# Patient Record
Sex: Female | Born: 1970 | Race: Black or African American | Hispanic: No | Marital: Married | Smoking: Never smoker
Health system: Southern US, Community
[De-identification: ages and names within clinical notes are randomized; demographics above are authoritative.]

## PROBLEM LIST (undated history)

## (undated) DIAGNOSIS — B009 Herpesviral infection, unspecified: Secondary | ICD-10-CM

## (undated) DIAGNOSIS — D219 Benign neoplasm of connective and other soft tissue, unspecified: Secondary | ICD-10-CM

## (undated) DIAGNOSIS — N83202 Unspecified ovarian cyst, left side: Secondary | ICD-10-CM

## (undated) HISTORY — DX: Benign neoplasm of connective and other soft tissue, unspecified: D21.9

## (undated) HISTORY — DX: Herpesviral infection, unspecified: B00.9

## (undated) HISTORY — DX: Unspecified ovarian cyst, left side: N83.202

---

## 1993-05-27 HISTORY — PX: MOLE REMOVAL: SHX2046

## 2004-05-27 HISTORY — PX: COLONOSCOPY: SHX174

## 2005-11-15 ENCOUNTER — Ambulatory Visit: Payer: Self-pay | Admitting: Family Medicine

## 2005-11-29 ENCOUNTER — Other Ambulatory Visit: Admission: RE | Admit: 2005-11-29 | Discharge: 2005-11-29 | Payer: Self-pay | Admitting: Obstetrics and Gynecology

## 2005-12-03 ENCOUNTER — Ambulatory Visit: Payer: Self-pay | Admitting: Internal Medicine

## 2006-03-18 LAB — CONVERTED CEMR LAB: Pap Smear: NORMAL

## 2008-03-01 ENCOUNTER — Emergency Department (HOSPITAL_COMMUNITY): Admission: EM | Admit: 2008-03-01 | Discharge: 2008-03-01 | Payer: Self-pay | Admitting: Emergency Medicine

## 2008-03-08 ENCOUNTER — Ambulatory Visit: Payer: Self-pay | Admitting: Internal Medicine

## 2008-03-08 DIAGNOSIS — R0602 Shortness of breath: Secondary | ICD-10-CM | POA: Insufficient documentation

## 2008-03-08 DIAGNOSIS — J310 Chronic rhinitis: Secondary | ICD-10-CM | POA: Insufficient documentation

## 2008-03-29 ENCOUNTER — Telehealth: Payer: Self-pay | Admitting: Internal Medicine

## 2008-04-01 ENCOUNTER — Telehealth: Payer: Self-pay | Admitting: Internal Medicine

## 2008-04-08 ENCOUNTER — Ambulatory Visit: Payer: Self-pay | Admitting: *Deleted

## 2008-04-08 DIAGNOSIS — J019 Acute sinusitis, unspecified: Secondary | ICD-10-CM | POA: Insufficient documentation

## 2008-04-11 ENCOUNTER — Telehealth (INDEPENDENT_AMBULATORY_CARE_PROVIDER_SITE_OTHER): Payer: Self-pay | Admitting: *Deleted

## 2008-04-19 ENCOUNTER — Ambulatory Visit: Payer: Self-pay | Admitting: *Deleted

## 2008-04-19 DIAGNOSIS — J309 Allergic rhinitis, unspecified: Secondary | ICD-10-CM | POA: Insufficient documentation

## 2008-04-25 ENCOUNTER — Telehealth: Payer: Self-pay | Admitting: Internal Medicine

## 2008-04-27 ENCOUNTER — Telehealth (INDEPENDENT_AMBULATORY_CARE_PROVIDER_SITE_OTHER): Payer: Self-pay | Admitting: *Deleted

## 2008-04-27 ENCOUNTER — Ambulatory Visit (HOSPITAL_BASED_OUTPATIENT_CLINIC_OR_DEPARTMENT_OTHER): Admission: RE | Admit: 2008-04-27 | Discharge: 2008-04-27 | Payer: Self-pay | Admitting: *Deleted

## 2008-04-27 ENCOUNTER — Ambulatory Visit: Payer: Self-pay | Admitting: Diagnostic Radiology

## 2008-04-27 DIAGNOSIS — J329 Chronic sinusitis, unspecified: Secondary | ICD-10-CM | POA: Insufficient documentation

## 2008-05-02 ENCOUNTER — Telehealth (INDEPENDENT_AMBULATORY_CARE_PROVIDER_SITE_OTHER): Payer: Self-pay | Admitting: *Deleted

## 2008-05-04 ENCOUNTER — Encounter (INDEPENDENT_AMBULATORY_CARE_PROVIDER_SITE_OTHER): Payer: Self-pay | Admitting: *Deleted

## 2008-05-23 ENCOUNTER — Telehealth (INDEPENDENT_AMBULATORY_CARE_PROVIDER_SITE_OTHER): Payer: Self-pay | Admitting: *Deleted

## 2008-06-01 ENCOUNTER — Telehealth (INDEPENDENT_AMBULATORY_CARE_PROVIDER_SITE_OTHER): Payer: Self-pay | Admitting: *Deleted

## 2008-06-09 ENCOUNTER — Telehealth (INDEPENDENT_AMBULATORY_CARE_PROVIDER_SITE_OTHER): Payer: Self-pay | Admitting: *Deleted

## 2008-06-21 ENCOUNTER — Encounter (INDEPENDENT_AMBULATORY_CARE_PROVIDER_SITE_OTHER): Payer: Self-pay | Admitting: *Deleted

## 2008-07-01 ENCOUNTER — Ambulatory Visit: Payer: Self-pay | Admitting: Internal Medicine

## 2008-07-06 ENCOUNTER — Ambulatory Visit: Payer: Self-pay | Admitting: Cardiology

## 2008-08-04 ENCOUNTER — Encounter: Payer: Self-pay | Admitting: Internal Medicine

## 2008-08-09 ENCOUNTER — Ambulatory Visit: Payer: Self-pay | Admitting: Internal Medicine

## 2010-10-12 NOTE — Assessment & Plan Note (Signed)
[facility]                           GASTROENTEROLOGY OFFICE NOTE   NAME:Kathy Blankenship, Kathy Blankenship                         MRN:          [id number]  DATE:12/03/2005                            DOB:          17-Nov-1970    REFERRING PHYSICIAN:  Grayling Congress LOWNE   REFERRING PHYSICIAN:  Loreen Freud, MD   REASON FOR CONSULTATION:  Constipation and rectal pain and bleeding.   HISTORY:  This a pleasant 40 year old African-American female with no  significant past medical history, who was referred through the courtesy of  Dr. Laury Axon regarding constipation, rectal pain and rectal bleeding.  The  patient had similar problems while living in [place] earlier this year.  For that she underwent complete colonoscopy with Dr. Angela Burke.  The  report from that procedure is available for review.  It stated that the  patient had a complete exam and a good preparation.  No abnormalities found  except for small internal hemorrhoids.  The latter was felt likely  responsible for bleeding.  She was prescribed fiber and Zelnorm.  The  patient never took Zelnorm.  She was using Benefiber and states that this  was helpful.  She has not been using it over the past month and has had  recurrent problems with constipation.  As well, rectal discomfort with the  passage of stools intermittently and rectal bleeding intermittently.  No  significant abdominal pain.  She presents now for evaluation.   PAST MEDICAL HISTORY:  None.   PAST SURGICAL HISTORY:  None.   CURRENT MEDICATIONS:  None.   ALLERGIES:  NO KNOWN DRUG ALLERGIES.   FAMILY HISTORY:  Negative for gastrointestinal malignancy.   SOCIAL HISTORY:  The patient is a divorced without children and attended  college for two years, has returned to [place] after living in [place] for seven years.  She lives with her mother.  She does not smoke,  rarely uses alcohol.   REVIEW OF SYSTEMS:  Per diagnostic evaluation  form.   PHYSICAL EXAMINATION:  GENERAL:  Pleasant well appearing female in no acute  distress.  VITAL SIGNS:  Blood pressure 112/72, heart rate 68, weight 142.2 pounds.  She is 5 feet 4-1/2 inches in height.  HEENT:  Sclerae anicteric.  Conjunctivae pink.  Oral mucosa intact, no  adenopathy.  LUNGS:  Clear.  HEART:  Regular.  ABDOMEN:  Soft without tenderness, mass or hernia.  RECTAL:  Exam reveals no external abnormalities.  There is a small posterior  fissure which is tender.  EXTREMITIES:  Without edema.   IMPRESSION:  1.  Functional constipation.  2.  Rectal pain and bleeding due to fissure.   RECOMMENDATIONS:  1.  Resume Benefiber.  2.  MiraLax 17 g in 10 ounces of water daily, titrate to need.  3.  Sitz baths twice daily.  4.  Analpram cream twice daily as needed.  5.  GI follow-up as needed.  Wilhemina Bonito. Eda Keys., MD   JNP/MedQ  DD:  12/03/2005  DT:  12/03/2005  Job #:  [id number]   cc:   Loreen Freud, MD

## 2011-02-26 LAB — POCT CARDIAC MARKERS
CKMB, poc: <1 — ABNORMAL LOW
Myoglobin, poc: 51.5
Troponin i, poc: <0.05

## 2011-02-26 LAB — D-DIMER, QUANTITATIVE: D-Dimer, Quant: 0.54 — ABNORMAL HIGH

## 2011-02-26 LAB — POCT I-STAT, CHEM 8
Chloride: 110
Glucose, Bld: 93
HCT: 38
Hemoglobin: 12.9
TCO2: 22

## 2012-03-02 ENCOUNTER — Encounter: Payer: Self-pay | Admitting: Obstetrics and Gynecology

## 2012-03-02 ENCOUNTER — Ambulatory Visit (INDEPENDENT_AMBULATORY_CARE_PROVIDER_SITE_OTHER): Payer: 59 | Admitting: Obstetrics and Gynecology

## 2012-03-02 VITALS — BP 96/72 | Wt 140.0 lb

## 2012-03-02 DIAGNOSIS — N921 Excessive and frequent menstruation with irregular cycle: Secondary | ICD-10-CM

## 2012-03-02 DIAGNOSIS — R102 Pelvic and perineal pain: Secondary | ICD-10-CM

## 2012-03-02 DIAGNOSIS — N949 Unspecified condition associated with female genital organs and menstrual cycle: Secondary | ICD-10-CM

## 2012-03-02 LAB — CBC
HCT: 38.9 % (ref 36.0–46.0)
Hemoglobin: 13.4 g/dL (ref 12.0–15.0)
MCHC: 34.4 g/dL (ref 30.0–36.0)
RBC: 4.64 MIL/uL (ref 3.87–5.11)
RDW: 13.2 % (ref 11.5–15.5)

## 2012-03-02 LAB — TSH: TSH: 2.258 u[IU]/mL (ref 0.350–4.500)

## 2012-03-02 MED ORDER — ALPRAZOLAM 0.5 MG PO TABS: 0.5000 mg | ORAL_TABLET | Freq: Every evening | ORAL | Status: DC | PRN

## 2012-03-02 MED ORDER — NORETHIN-ETH ESTRADIOL-FE 0.4-35 MG-MCG PO CHEW: 1.0000 | CHEWABLE_TABLET | Freq: Every day | ORAL | Status: DC

## 2012-03-02 NOTE — Addendum Note (Signed)
Addended by: Jaymes Graff on: 03/02/2012 09:22 AM   Modules accepted: Orders

## 2012-03-02 NOTE — Progress Notes (Signed)
When did bleeding start: 02/03/12 How  Long: 3 weeks How often changing pad/tampon: wears panty liner Bleeding Disorders: no Cramping: yes Contraception: yes, pill Fibroids: yes Hormone Therapy: no New Medications: no Menopausal Symptoms: no Vag. Discharge: no Abdominal Pain: yes Increased Stress: no Pt has c/o of irregular bleeding on her pills for one year.  She had mild cramping and does not forget to take her pills BP 96/72  Wt 140 lb (63.504 kg)  LMP 02/03/2012 Physical Examination: General appearance - alert, well appearing, and in no distress Chest - clear to auscultation, no wheezes, rales or rhonchi, symmetric air entry Heart - normal rate and regular rhythm Abdomen - soft, nontender, nondistended, no masses or organomegaly Pelvic - normal external genitalia, vulva, vagina, cervix, uterus mildly irregular and adnexa Fibroids BTB on pills Check tsh, prl, cbc Us/shg/embx @[place] 

## 2012-03-02 NOTE — Addendum Note (Signed)
Addended by: Rolla Plate on: 03/02/2012 09:17 AM   Modules accepted: Orders

## 2012-03-02 NOTE — Patient Instructions (Signed)
Uterine Fibroid  A uterine fibroid is a growth (tumor) that occurs in a woman's uterus. This type of tumor is not cancerous and does not spread out of the uterus. A woman can have one or many fibroids, and the fiboid(s) can become quite large. A fibroid can vary in size, weight, and where it grows in the uterus. Most fibroids do not require medical treatment, but some can cause pain or heavy bleeding during and between periods.  CAUSES   A fibroid is the result of a single uterine cell that keeps growing (unregulated), which is different than most cells in the human body. Most cells have a control mechanism that keeps them from reproducing without control.   SYMPTOMS    Bleeding.   Pelvic pain and pressure.   Bladder problems due to the size of the fibroid.   Infertility and miscarriages depending on the size and location of the fibroid.  DIAGNOSIS   A diagnosis is made by physical exam. Your caregiver may feel the lumpy tumors during a pelvic exam. Important information regarding size, location, and number of tumors can be gained by having an ultrasound. It is rare that other tests, such as a CT scan or MRI, are needed.  TREATMENT    Your caregiver may recommend watchful waiting. This involves getting the fibroid checked by your caregiver to see if the fibroids grow or shrink.    Hormonal treatment or an intrauterine device (IUD) may be prescribed.    Surgery may be needed to remove the fibroids (myomectomy) or the uterus (hysterectomy). This depends on your situation.  When fibroids interfere with fertility and a woman wants to become pregnant, a caregiver may recommend having the fibroids removed.   HOME CARE INSTRUCTIONS   Home care depends on how you were treated. In general:    Keep all follow-up appointments with your caregiver.    Only take medicine as told by your caregiver. Do not take aspirin. It can cause bleeding.    If you have excessive periods and soak tampons or pads in a half hour or  less, contact your caregiver immediately. If your periods are troublesome but not so heavy, lie down with your feet raised slightly above your heart. Place cold packs on your lower abdomen.    If your periods are heavy, write down the number of pads or tampons you use per month. Bring this information to your caregiver.    Talk to your caregiver about taking iron pills.    Include green vegetables in your diet.    If you were prescribed a hormonal treatment, take the hormonal medicines as directed.    If you need surgery, ask your caregiver for information on your specific surgery.   SEEK IMMEDIATE MEDICAL CARE IF:   You have pelvic pain or cramps not controlled with medicines.    You have a sudden increase in pelvic pain.    You have an increase of bleeding between and during periods.    You feel lightheaded or have fainting episodes.   MAKE SURE YOU:   Understand these instructions.   Will watch your condition.   Will get help right away if you are not doing well or get worse.  Document Released: 05/10/2000 Document Revised: 08/05/2011 Document Reviewed: 06/03/2011  ExitCare Patient Information 2013 [employer].

## 2012-03-03 ENCOUNTER — Telehealth: Payer: Self-pay | Admitting: Obstetrics and Gynecology

## 2012-03-03 LAB — GC/CHLAMYDIA PROBE AMP, GENITAL: GC Probe Amp, Genital: NEGATIVE

## 2012-03-03 NOTE — Telephone Encounter (Signed)
Spoke with pt rgd msg pt has some questions rgd starting new bc and sonohyst advised pt complete current pack of pills then start new pack the call first day of cycle to schd sonohyst pt voice understanding

## 2012-04-16 ENCOUNTER — Telehealth: Payer: Self-pay | Admitting: Obstetrics and Gynecology

## 2012-04-16 NOTE — Telephone Encounter (Signed)
Tc to pt regarding msg.  Pt states she is suppose to have a SHG but was told was going to be while before an available appt.  So, pt is currently on Femcon Fe to help with her bldg.  Was switched to higher dosage per ND because lower dosage was causing her to have bldg everyday.  Higher dosage was helping for a while, did not start cycle at placebos, came on about a week later and is experiencing cramping in her legs and back.  No meds taken, pt wants to know if what she is experiencing is normal.  Informed per ND, pt advised to continue taking pills everyday on time and try taking Motrin 600 mg Q 6hrs and will be working out a sooner appt for the [facility].  Pt voices agreement, pt transferred to [facility] for scheduling of SHG.

## 2012-04-16 NOTE — Telephone Encounter (Signed)
Tc to pt regarding msg, lm on v to call back.

## 2012-04-27 ENCOUNTER — Ambulatory Visit (INDEPENDENT_AMBULATORY_CARE_PROVIDER_SITE_OTHER): Payer: 59 | Admitting: Obstetrics and Gynecology

## 2012-04-27 ENCOUNTER — Encounter: Payer: Self-pay | Admitting: Obstetrics and Gynecology

## 2012-04-27 ENCOUNTER — Ambulatory Visit: Payer: 59

## 2012-04-27 VITALS — BP 118/76 | Wt 142.0 lb

## 2012-04-27 DIAGNOSIS — N921 Excessive and frequent menstruation with irregular cycle: Secondary | ICD-10-CM

## 2012-04-27 DIAGNOSIS — Z0000 Encounter for general adult medical examination without abnormal findings: Secondary | ICD-10-CM

## 2012-04-27 DIAGNOSIS — IMO0002 Reserved for concepts with insufficient information to code with codable children: Secondary | ICD-10-CM

## 2012-04-27 DIAGNOSIS — N926 Irregular menstruation, unspecified: Secondary | ICD-10-CM

## 2012-04-27 NOTE — Patient Instructions (Signed)
Endometrial Biopsy This is a test in which a tissue sample (a biopsy) is taken from inside the uterus (womb). It is then looked at by a specialist under a microscope to see if the tissue is normal or abnormal. The endometrium is the lining of the uterus. This test helps determine where you are in your menstrual cycle and how hormone levels are affecting the lining of the uterus. Another use for this test is to diagnose endometrial cancer, tuberculosis, polyps, or inflammatory conditions and to evaluate uterine bleeding. PREPARATION FOR TEST No preparation or fasting is necessary. NORMAL FINDINGS No pathologic conditions. Presence of "secretory-type" endometrium 3 to 5 days before to normal menstruation. Ranges for normal findings may vary among different laboratories and hospitals. You should always check with your doctor after having lab work or other tests done to discuss the meaning of your test results and whether your values are considered within normal limits. MEANING OF TEST  Your caregiver will go over the test results with you and discuss the importance and meaning of your results, as well as treatment options and the need for additional tests if necessary. OBTAINING THE TEST RESULTS It is your responsibility to obtain your test results. Ask the lab or department performing the test when and how you will get your results. Document Released: 09/13/2004 Document Revised: 08/05/2011 Document Reviewed: 04/22/2008 ExitCare Patient Information 2013 [employer].  

## 2012-04-27 NOTE — Progress Notes (Signed)
Pt came late and unable to do SHG BP 118/76  Wt 142 lb (64.411 kg)  LMP 04/15/2012 Physical Examination: Abdomen - soft, nontender, nondistended, no masses or organomegaly Pelvic - VULVA: normal appearing vulva with no masses, tenderness or lesions, VAGINA: normal appearing vagina with normal color and discharge, no lesions, CERVIX: normal appearing cervix without discharge or lesions Menorrhagia Pt states it is better with OCPS Motrin prn dysmenorrhea SHG r/S EMBX done per protocol.  Sound to 7 cm Motrin 400mg  given today after procedure.  Pt had already taken 400mg 

## 2012-04-29 LAB — PATHOLOGY

## 2012-05-18 ENCOUNTER — Other Ambulatory Visit: Payer: Self-pay | Admitting: Obstetrics and Gynecology

## 2012-05-18 ENCOUNTER — Ambulatory Visit (INDEPENDENT_AMBULATORY_CARE_PROVIDER_SITE_OTHER): Payer: 59 | Admitting: Obstetrics and Gynecology

## 2012-05-18 ENCOUNTER — Encounter: Payer: Self-pay | Admitting: Obstetrics and Gynecology

## 2012-05-18 ENCOUNTER — Ambulatory Visit (INDEPENDENT_AMBULATORY_CARE_PROVIDER_SITE_OTHER): Payer: 59

## 2012-05-18 ENCOUNTER — Other Ambulatory Visit: Payer: Self-pay

## 2012-05-18 VITALS — BP 110/78 | Wt 144.5 lb

## 2012-05-18 DIAGNOSIS — N926 Irregular menstruation, unspecified: Secondary | ICD-10-CM

## 2012-05-18 DIAGNOSIS — N840 Polyp of corpus uteri: Secondary | ICD-10-CM

## 2012-05-18 DIAGNOSIS — N83209 Unspecified ovarian cyst, unspecified side: Secondary | ICD-10-CM

## 2012-05-18 MED ORDER — NORETHIN-ETH ESTRADIOL-FE 0.4-35 MG-MCG PO CHEW: 1.0000 | CHEWABLE_TABLET | Freq: Every day | ORAL | Status: DC

## 2012-05-18 NOTE — Progress Notes (Signed)
[place] 8.59 cm by 5.27 cm Bilateral ovaries seen.  4.27 cm simple ovarian cyst Right ovary WNL SHG 1.3 cm polyp seen.  SHG done per protocol Pt given option of OBS vs hystereroscopic polypectomy R&B of both reviewed.  Pt chose OBS Repeat SHG/US in three months to see if polyp and ovarian cyst has resolved

## 2012-05-29 ENCOUNTER — Telehealth: Payer: Self-pay | Admitting: Obstetrics and Gynecology

## 2012-05-29 ENCOUNTER — Ambulatory Visit (HOSPITAL_COMMUNITY)
Admission: RE | Admit: 2012-05-29 | Discharge: 2012-05-29 | Disposition: A | Discharge status: Home / Self Care | Payer: 59 | Source: Ambulatory Visit | Attending: Obstetrics and Gynecology | Admitting: Obstetrics and Gynecology

## 2012-05-29 DIAGNOSIS — M79659 Pain in unspecified thigh: Secondary | ICD-10-CM

## 2012-05-29 DIAGNOSIS — M79609 Pain in unspecified limb: Secondary | ICD-10-CM | POA: Insufficient documentation

## 2012-05-29 DIAGNOSIS — M79661 Pain in right lower leg: Secondary | ICD-10-CM

## 2012-05-29 DIAGNOSIS — M79662 Pain in left lower leg: Secondary | ICD-10-CM

## 2012-05-29 NOTE — Telephone Encounter (Signed)
Spoke with pt rgd msg pt states on bc  Having calf and thigh pain in both legs after starting new pack of pills on 05/24/12 pt tried taking ibuprofen no relief advised pt will consult with nd and call her back pt voice understanding

## 2012-05-29 NOTE — Telephone Encounter (Signed)
Consult with nd informed info below per ND pt need doppler both legs and calves to  r/o dvt call report to AVS dr on call pt has appt at [facility] long at 2:00 pt voice understanding informed AVS of pt going to have dopplers done

## 2012-05-29 NOTE — Progress Notes (Addendum)
*  Preliminary Results* Bilateral lower extremity venous duplex completed. Bilateral lower extremities are negative for deep vein thrombosis. No evidence of Baker's cyst bilaterally. Preliminary results discussed with Dr.Stringer.  05/29/2012 3:08 PM Gertie Fey, RDMS, RDCS

## 2012-11-17 ENCOUNTER — Encounter: Payer: Self-pay | Admitting: *Deleted

## 2012-11-17 ENCOUNTER — Emergency Department
Admission: EM | Admit: 2012-11-17 | Discharge: 2012-11-17 | Disposition: A | Discharge status: Home / Self Care | Payer: Self-pay | Source: Home / Self Care | Attending: Family Medicine | Admitting: Family Medicine

## 2012-11-17 DIAGNOSIS — L723 Sebaceous cyst: Secondary | ICD-10-CM

## 2012-11-17 MED ORDER — DOXYCYCLINE HYCLATE 100 MG PO CAPS: 100.0000 mg | ORAL_CAPSULE | Freq: Two times a day (BID) | ORAL | Status: DC

## 2012-11-17 NOTE — ED Notes (Signed)
Kathy Blankenship reports lump/infection to left axilla x 4 days. Denies fever or chills. No hx of MRSA.

## 2012-11-17 NOTE — ED Provider Notes (Signed)
History    CSN: [id number] Arrival date & time 11/17/12  1118  First MD Initiated Contact with Patient 11/17/12 1133     Chief Complaint  Patient presents with  . Abcess/skin infection       HPI Comments: Patient complains of a 4 day history of tender nodule in left axilla, gradually expanding.  No drainage from lesion.  No fevers, chills, and sweats   Patient is a 42 y.o. female presenting with abscess. The history is provided by the patient.  Abscess Abscess location: left axilla. Size:  2cm by 1.5cm Abscess quality: fluctuance, induration, painful and redness   Abscess quality: not draining, no itching, no warmth and not weeping   Red streaking: no   Duration:  4 days Progression:  Worsening Pain details:    Quality:  Pressure   Severity:  Mild   Duration:  4 days   Timing:  Constant   Progression:  Worsening Chronicity:  New Relieved by:  Nothing Worsened by:  Nothing tried Ineffective treatments: topical ointment. Associated symptoms: no fatigue and no fever   Risk factors: no hx of MRSA    Past Medical History  Diagnosis Date  . HSV-2 (herpes simplex virus 2) infection   . Fibroids   . Left ovarian cyst    History reviewed. No pertinent past surgical history. Family History  Problem Relation Age of Onset  . Rheum arthritis Father    History  Substance Use Topics  . Smoking status: Never Smoker   . Smokeless tobacco: Never Used  . Alcohol Use: No   OB History   Grav Para Term Preterm Abortions TAB SAB Ect Mult Living   0 0        0     Review of Systems  Constitutional: Negative for fever and fatigue.  All other systems reviewed and are negative.    Allergies  Review of patient's allergies indicates no known allergies.  Home Medications   Current Outpatient Rx  Name  Route  Sig  Dispense  Refill  . ALPRAZolam (XANAX) 0.5 MG tablet   Oral   Take 1 tablet (0.5 mg total) by mouth at bedtime as needed.   4 tablet   0     Take thirty  minutes before procedure   . doxycycline (VIBRAMYCIN) 100 MG capsule   Oral   Take 1 capsule (100 mg total) by mouth 2 (two) times daily.   14 capsule   0   . Norethin-Eth Estradiol-Fe [facility] FE) 0.4-35 MG-MCG tablet   Oral   Chew 1 tablet by mouth daily.   1 Package   11     Please give the 0.4-50 mcg dose    BP 119/82  Pulse 83  Temp(Src) 99 F (37.2 C) (Oral)  Resp 14  Ht 5\' 5"  (1.651 m)  Wt 138 lb (62.596 kg)  BMI 22.96 kg/m2  SpO2 99%  LMP 11/15/2012 Physical Exam  Nursing note and vitals reviewed. Constitutional: She is oriented to person, place, and time. She appears well-developed and well-nourished. No distress.  HENT:  Head: Normocephalic.  Eyes: Conjunctivae are normal. Pupils are equal, round, and reactive to light.  Cardiovascular: Normal heart sounds.   Pulmonary/Chest: Breath sounds normal.  Lymphadenopathy:    She has no cervical adenopathy.  Neurological: She is alert and oriented to person, place, and time.  Skin: Skin is warm and dry.  Left axilla has a fluctuant 2cm by 1.5cm cystic mass, mildly tender to palpation.  No surrounding erythema.  No axillary adenopathy.    ED Course  Procedures  Procedure:  Incise and drain cyst/abscess Risks and benefits of procedure explained to patient and verbal consent obtained.  Using sterile technique and local anesthesia with 1% lidocaine with epinephrine, cleansed affected area with Betadine and saline. Identified the most fluctuant area of lesion and incised with #11 blade.  Expressed purulent material.  Inserted Iodoform gauze packing.  Bandage applied.  Patient tolerated well     Labs Reviewed  WOUND CULTURE pending    1. Infected sebaceous cyst of skin     MDM  Wound culture pending.  Begin doxycycline Return tomorrow for packing removal and dressing change Leave bandage in place until followup visit tomorrow, then change bandage daily until healed.  Lattie Haw, MD 11/17/12 [phone]

## 2012-11-18 ENCOUNTER — Encounter: Payer: Self-pay | Admitting: *Deleted

## 2012-11-18 ENCOUNTER — Emergency Department (INDEPENDENT_AMBULATORY_CARE_PROVIDER_SITE_OTHER)
Admission: EM | Admit: 2012-11-18 | Discharge: 2012-11-18 | Disposition: A | Discharge status: Home / Self Care | Payer: 59 | Source: Home / Self Care | Attending: Emergency Medicine | Admitting: Emergency Medicine

## 2012-11-18 DIAGNOSIS — L02412 Cutaneous abscess of left axilla: Secondary | ICD-10-CM

## 2012-11-18 DIAGNOSIS — IMO0002 Reserved for concepts with insufficient information to code with codable children: Secondary | ICD-10-CM

## 2012-11-18 NOTE — ED Notes (Signed)
The pt is here today for a wound check in her LT axilla area. She had an I & D yesterday and was placed on Doxycycline.

## 2012-11-18 NOTE — ED Provider Notes (Signed)
History    CSN: [id number] Arrival date & time 11/18/12  1110  First MD Initiated Contact with Patient 11/18/12 1121     Chief Complaint  Patient presents with  . Wound Check    HPI  Yesterday had I and D of left axillary abscess. Here for recheck of the wound. She started the doxycycline yesterday as prescribed. She denies any fever. Has not noted any drainage or bleeding on the dressing since the I&D was performed and iodoform gauze was placed. The pain left axilla is significantly decreased. Only has minimal pain to touching. Not needing any pain medication. She has returned to her usual function. Past Medical History  Diagnosis Date  . HSV-2 (herpes simplex virus 2) infection   . Fibroids   . Left ovarian cyst    History reviewed. No pertinent past surgical history. Family History  Problem Relation Age of Onset  . Rheum arthritis Father    History  Substance Use Topics  . Smoking status: Never Smoker   . Smokeless tobacco: Never Used  . Alcohol Use: No   OB History   Grav Para Term Preterm Abortions TAB SAB Ect Mult Living   0 0        0     Review of Systems  Constitutional: Negative for fever.  All other systems reviewed and are negative.    Allergies  Review of patient's allergies indicates no known allergies.  Home Medications   Current Outpatient Rx  Name  Route  Sig  Dispense  Refill  . ALPRAZolam (XANAX) 0.5 MG tablet   Oral   Take 1 tablet (0.5 mg total) by mouth at bedtime as needed.   4 tablet   0     Take thirty minutes before procedure   . doxycycline (VIBRAMYCIN) 100 MG capsule   Oral   Take 1 capsule (100 mg total) by mouth 2 (two) times daily.   14 capsule   0   . Norethin-Eth Estradiol-Fe [facility] FE) 0.4-35 MG-MCG tablet   Oral   Chew 1 tablet by mouth daily.   1 Package   11     Please give the 0.4-50 mcg dose    BP 131/84  Pulse 98  Temp(Src) 98.9 F (37.2 C) (Oral)  Resp 18  Wt 137 lb (62.143 kg)  BMI 22.8 kg/m2   SpO2 99%  LMP 11/15/2012 Physical Exam  Nursing note and vitals reviewed. Constitutional: She is oriented to person, place, and time. She appears well-developed and well-nourished. No distress.  HENT:  Head: Normocephalic and atraumatic.  Eyes: Conjunctivae and EOM are normal. Pupils are equal, round, and reactive to light. No scleral icterus.  Neck: Normal range of motion.  Cardiovascular: Normal rate.   Pulmonary/Chest: Effort normal.  Abdominal: She exhibits no distension.  Musculoskeletal: Normal range of motion.  Neurological: She is alert and oriented to person, place, and time.  Skin: Skin is warm.  Psychiatric: She has a normal mood and affect.   Left axilla: There is a 1 x 1 cm area of mild swelling, mild redness, minimal tenderness, without drainage or bleeding. In the center is intact iodoform gauze that was placed yesterday.--No red streaks. Function of left upper extremity normal. Neurovascular distally intact  ED Course  Procedures (including critical care time) Labs Reviewed - No data to display No results found. 1. Abscess of left axilla     MDM  Doing well. Abscess of left axilla improving significantly after I&D yesterday. Today, I  withdrew the iodoform gauze 1/2 inch and elected to keep the remainder of gauze in place for now.--She tolerated this well. New bulky dressing applied .--Home care discussed, keep clean and dry until returning here in 2 days for complete removal of the iodoform gauze. I checked the status of the wound culture from yesterday, which is still pending. Continue doxycycline as prescribed. Return here in 2 days for recheck and anticipate complete removal of the iodoform gauze.  Lajean Manes, MD 11/18/12 [phone]

## 2012-11-19 ENCOUNTER — Telehealth: Payer: Self-pay | Admitting: Emergency Medicine

## 2012-11-19 LAB — WOUND CULTURE
Gram Stain: NONE SEEN
Organism ID, Bacteria: NO GROWTH

## 2012-11-20 ENCOUNTER — Encounter: Payer: Self-pay | Admitting: *Deleted

## 2012-11-20 ENCOUNTER — Emergency Department (INDEPENDENT_AMBULATORY_CARE_PROVIDER_SITE_OTHER)
Admission: EM | Admit: 2012-11-20 | Discharge: 2012-11-20 | Disposition: A | Discharge status: Home / Self Care | Payer: 59 | Source: Home / Self Care | Attending: Family Medicine | Admitting: Family Medicine

## 2012-11-20 DIAGNOSIS — Z4801 Encounter for change or removal of surgical wound dressing: Secondary | ICD-10-CM

## 2012-11-20 NOTE — ED Notes (Signed)
F/u on I&D of left axilla. Denies worsening pain, fever/chills.

## 2012-11-20 NOTE — ED Provider Notes (Signed)
   History    CSN: [id number] Arrival date & time 11/20/12  1051  First MD Initiated Contact with Patient 11/20/12 1126     Chief Complaint  Patient presents with  . Wound Check      HPI Comments: Presents for dressing change left axilla.  No complaints.  Patient is a 42 y.o. female presenting with wound check. The history is provided by the patient.  Wound Check This is a new problem. The problem has been rapidly improving. Associated symptoms comments: none.   Past Medical History  Diagnosis Date  . HSV-2 (herpes simplex virus 2) infection   . Fibroids   . Left ovarian cyst    History reviewed. No pertinent past surgical history. Family History  Problem Relation Age of Onset  . Rheum arthritis Father    History  Substance Use Topics  . Smoking status: Never Smoker   . Smokeless tobacco: Never Used  . Alcohol Use: No   OB History   Grav Para Term Preterm Abortions TAB SAB Ect Mult Living   0 0        0     Review of Systems  All other systems reviewed and are negative.    Allergies  Review of patient's allergies indicates no known allergies.  Home Medications   Current Outpatient Rx  Name  Route  Sig  Dispense  Refill  . ALPRAZolam (XANAX) 0.5 MG tablet   Oral   Take 1 tablet (0.5 mg total) by mouth at bedtime as needed.   4 tablet   0     Take thirty minutes before procedure   . doxycycline (VIBRAMYCIN) 100 MG capsule   Oral   Take 1 capsule (100 mg total) by mouth 2 (two) times daily.   14 capsule   0   . Norethin-Eth Estradiol-Fe [facility] FE) 0.4-35 MG-MCG tablet   Oral   Chew 1 tablet by mouth daily.   1 Package   11     Please give the 0.4-50 mcg dose    BP 119/80  Pulse 68  Temp(Src) 98.9 F (37.2 C) (Oral)  Resp 12  SpO2 100%  LMP 11/15/2012 Physical Exam Nursing notes and Vital Signs reviewed. Appearance:  Patient appears healthy, stated age, and in no acute distress Left axilla:  No swelling, erythema, warmth, or tenderness.   Removed iodoform gauze; no purulent drainage.  Wound cavity approximately 2 to 3mm deep.  Applied bandage. Skin:  No rash present.   ED Course  Procedures  none   1. Dressing change or removal, surgical wound     MDM   Change dressing daily until healed.  Finish doxycycline.  Lattie Haw, MD 11/20/12 1144

## 2013-12-14 DIAGNOSIS — R0989 Other specified symptoms and signs involving the circulatory and respiratory systems: Secondary | ICD-10-CM | POA: Insufficient documentation

## 2013-12-14 DIAGNOSIS — R29898 Other symptoms and signs involving the musculoskeletal system: Secondary | ICD-10-CM | POA: Insufficient documentation

## 2013-12-20 ENCOUNTER — Ambulatory Visit (INDEPENDENT_AMBULATORY_CARE_PROVIDER_SITE_OTHER): Payer: PRIVATE HEALTH INSURANCE | Admitting: Family Medicine

## 2013-12-20 ENCOUNTER — Encounter: Payer: Self-pay | Admitting: Family Medicine

## 2013-12-20 VITALS — BP 105/64 | HR 92 | Ht 65.0 in | Wt 144.0 lb

## 2013-12-20 DIAGNOSIS — E785 Hyperlipidemia, unspecified: Secondary | ICD-10-CM | POA: Insufficient documentation

## 2013-12-20 DIAGNOSIS — Z2839 Other underimmunization status: Secondary | ICD-10-CM

## 2013-12-20 DIAGNOSIS — J309 Allergic rhinitis, unspecified: Secondary | ICD-10-CM

## 2013-12-20 DIAGNOSIS — Z23 Encounter for immunization: Secondary | ICD-10-CM

## 2013-12-20 DIAGNOSIS — J302 Other seasonal allergic rhinitis: Secondary | ICD-10-CM

## 2013-12-20 DIAGNOSIS — G459 Transient cerebral ischemic attack, unspecified: Secondary | ICD-10-CM

## 2013-12-20 DIAGNOSIS — Z283 Underimmunization status: Secondary | ICD-10-CM

## 2013-12-20 MED ORDER — ASPIRIN EC 325 MG PO TBEC: 325.0000 mg | DELAYED_RELEASE_TABLET | Freq: Every day | ORAL | Status: DC

## 2013-12-20 MED ORDER — CETIRIZINE HCL 10 MG PO TABS: 10.0000 mg | ORAL_TABLET | Freq: Every day | ORAL | Status: DC

## 2013-12-20 NOTE — Progress Notes (Signed)
CC: Kathy Blankenship is a 43 y.o. female is here for Establish Care   Subjective: HPI:  Very pleasant 43 year old here to establish care.  Patient presents for hospital followup from a one-day hospitalization for left upper extremity weakness. She describes awakening for no particular reason at 3 AM on the 21st and noticed that her entire left arm felt numb and somewhat weak. Over the course of a few minutes she noticed that the intensity of this was increasing from mild to moderate in severity. She presented about an hour later at a local emergency room she described the sensation as severe. She believes that the episode lasted a total of 4 hours and did not resolve from anything in particular that she is aware of. While being hospitalized she had a unremarkable metabolic panel, unremarkable CBC, unremarkable lipid panel. EKG was unremarkable, MRI of the brain was normal CT scan of the head was normal, echocardiogram was unremarkable. Carotid ultrasound was normal. She was started on aspirin and Lipitor but has reservations about taking Lipitor and is only taking aspirin at this time. She denies any other motor or sensory disturbances recently or remotely. She's only had this episode once and has no history of TIAs or strokes leading up to this event. Since discharge she states she's back to her regular state of health.  Patient complains of persistent difficulty with fighting allergies present at different times of the year and described as a stuffiness in the nose and pressure between the eyes and below the eyes. Symptoms are slightly improved with essential oils placed on her face. No other interventions as of yet. Symptoms seem to be worse in the evening and when trying to fall sleep often keeping her awake at night. Overall symptoms are moderate in severity and present most days of the year. Nothing particularly makes better or worse other than that described above. She's particularly inquired about  whether or not she should see an allergist. She's had no benefit from nasal steroids specifically Nasacort  Review of Systems - General ROS: negative for - chills, fever, night sweats, weight gain or weight loss Ophthalmic ROS: negative for - decreased vision Psychological ROS: negative for - anxiety or depression ENT ROS: negative for - hearing change,tinnitus  Hematological and Lymphatic ROS: negative for - bleeding problems, bruising or swollen lymph nodes Breast ROS: negative Respiratory ROS: no cough, shortness of breath, or wheezing Cardiovascular ROS: no chest pain or dyspnea on exertion Gastrointestinal ROS: no abdominal pain, change in bowel habits, or black or bloody stools Genito-Urinary ROS: negative for - genital discharge, genital ulcers, incontinence or abnormal bleeding from genitals Musculoskeletal ROS: negative for - joint pain or muscle pain Neurological ROS: negative for - headaches or memory loss Dermatological ROS: negative for lumps, mole changes, rash and skin lesion changes   Past Medical History  Diagnosis Date  . HSV-2 (herpes simplex virus 2) infection   . Fibroids   . Left ovarian cyst     Past Surgical History  Procedure Laterality Date  . Mole removal  1995  . Colonoscopy  2006   Family History  Problem Relation Age of Onset  . Rheum arthritis Father   . Heart attack Father   . Hypertension Mother     History   Social History  . Marital Status: Divorced    Spouse Name: N/A    Number of Children: N/A  . Years of Education: N/A   Occupational History  . Not on file.   Social  History Main Topics  . Smoking status: Never Smoker   . Smokeless tobacco: Never Used  . Alcohol Use: No  . Drug Use: No  . Sexual Activity: Yes    Partners: Male    Birth Control/ Protection: Condom, Pill     Comment: zenchent fe   Other Topics Concern  . Not on file   Social History Narrative  . No narrative on file     Objective: BP 105/64  Pulse 92   Ht 5' 5"  (1.651 m)  Wt 144 lb (65.318 kg)  BMI 23.96 kg/m2  General: Alert and Oriented, No Acute Distress HEENT: Pupils equal, round, reactive to light. Conjunctivae clear.  Moist membranes pharynx unremarkable Lungs: Clear comfortable work of breathing Cardiac: Regular rate and rhythm.  Extremities: No peripheral edema.  Strong peripheral pulses.  Neuro: CN II-XII grossly intact, full strength/rom of all four extremities, C5 DTRs 2/4 bilaterally, gait normal, rapid alternating movements normal, heel-shin test normal Mental Status: No depression, anxiety, nor agitation. Skin: Warm and dry.  Assessment & Plan: Kathy Blankenship was seen today for establish care.  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  Seasonal allergies - Ambulatory referral to Allergy  Other Orders - cetirizine (ZYRTEC) 10 MG tablet; Take 1 tablet (10 mg total) by mouth daily.    She's overdue for tetanus booster and she received it today Transient ischemic attack: Discussed with patient that standard of care includes been on aspirin and a statin even though her cholesterol is picture perfect and all her imaging studies were normal. She has reservations about taking this medication and I recommended that she seek a second opinion with neurology.  Time was taken to review all records from her hospital stay in the presence of the patient. Seasonal allergies: Start Zyrtec, she would like to see an allergist to see if she is a candidate for immunotherapy  45 minutes spent face-to-face during visit today of which at least 50% was counseling or coordinating care regarding: 1. 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 Transient cerebral ischemia, unspecified transient cerebral  ischemia type   3. Seasonal allergies        Return for 1-2 months for CPE after neurology referral..

## 2013-12-22 ENCOUNTER — Ambulatory Visit: Payer: 59 | Admitting: Physician Assistant

## 2014-01-14 ENCOUNTER — Encounter: Payer: Self-pay | Admitting: Neurology

## 2014-01-14 ENCOUNTER — Ambulatory Visit (INDEPENDENT_AMBULATORY_CARE_PROVIDER_SITE_OTHER): Payer: PRIVATE HEALTH INSURANCE | Admitting: Neurology

## 2014-01-14 ENCOUNTER — Telehealth: Payer: Self-pay | Admitting: Neurology

## 2014-01-14 VITALS — BP 88/60 | HR 56 | Resp 14 | Ht 65.0 in | Wt 143.0 lb

## 2014-01-14 DIAGNOSIS — R209 Unspecified disturbances of skin sensation: Secondary | ICD-10-CM

## 2014-01-14 DIAGNOSIS — R202 Paresthesia of skin: Principal | ICD-10-CM

## 2014-01-14 DIAGNOSIS — R200 Anesthesia of skin: Secondary | ICD-10-CM | POA: Insufficient documentation

## 2014-01-14 NOTE — Telephone Encounter (Signed)
PT RETURNING YOUR CALL PLEASE CALL [phone]

## 2014-01-14 NOTE — Progress Notes (Signed)
NEUROLOGY CONSULTATION NOTE  Kathy Blankenship MRN: [id number] DOB: May 05, 1971  Referring provider: Dr. Marcial Pacas Primary care provider: Dr. Marcial Pacas  Reason for consult:  Left arm numbness and weakness  Dear Dr Ileene Rubens:  Thank you for your kind referral of Kathy Blankenship for consultation of the above symptoms. Although her history is well known to you, please allow me to reiterate it for the purpose of our medical record. Records and images were personally reviewed where available.  HISTORY OF PRESENT ILLNESS: This is a very pleasant 43 year old right-handed woman with no significant past medical history, in her usual state of health until 12/14/2013 when she was admitted at [facility] for left arm numbness and weakness.  She reports that she had been having nasal and chest congestion for a couple of weeks, woke up at 3am that morning watching TV when she started noticing tingling and numbness on the left forearm.  She also noticed mild 3/10 pricking pain on the dorsum of her left hand.  The paresthesias started to go up to the left shoulder, and she started feeling discomfort in the arm and weakness where she could not hold up the arm for prolonged periods.  Face and leg were unaffected. Right side was unaffected. She denied any associated headache, dizziness, diplopia, dysarthria, dysphagia, neck pain.  Symptoms improved over 4 hours, however she continued to have low-grade paresthesias that she rates as a 2/10 on the left arm.  She underwent a TIA workup, MRI brain without contrast reported as normal. Echocardiogram and carotid dopplers normal. Her lipid panel showed an LDL of 82. HbA1c 5.4, TSH 4.10, B12 461.  She was discharged home on baby aspirin and low dose statin, however she did not want to take the statin.    She presents today 1 month after with continued paresthesias in the left arm that wax and wane in intensity for a couple of hours. If she carries something on  her left arm, she feels more strain on the arm.  She denies any significant neck pain. She denies any similar prior symptoms in the past. No head injuries, falls, recent travels. She has occasional congestion in her nasal region and in the back of her head. No episodes of vision loss in the past.  No bowel/bladder dysfunction.  She works on a Psychiatric nurse day.   PAST MEDICAL HISTORY: Past Medical History  Diagnosis Date  . HSV-2 (herpes simplex virus 2) infection   . Fibroids   . Left ovarian cyst     PAST SURGICAL HISTORY: Past Surgical History  Procedure Laterality Date  . Mole removal  1995  . Colonoscopy  2006    MEDICATIONS: Current Outpatient Prescriptions on File Prior to Visit  Medication Sig Dispense Refill  . aspirin EC 325 MG tablet Take 1 tablet (325 mg total) by mouth daily.  30 tablet  0   No current facility-administered medications on file prior to visit.    ALLERGIES: No Known Allergies  FAMILY HISTORY: Family History  Problem Relation Age of Onset  . Rheum arthritis Father   . Heart attack Father   . Hypertension Mother     SOCIAL HISTORY: History   Social History  . Marital Status: Divorced    Spouse Name: N/A    Number of Children: N/A  . Years of Education: N/A   Occupational History  . Not on file.   Social History Main Topics  . Smoking status: Never Smoker   .  Smokeless tobacco: Never Used  . Alcohol Use: No  . Drug Use: No  . Sexual Activity: Yes    Partners: Male    Birth Control/ Protection: Condom, Pill     Comment: zenchent fe   Other Topics Concern  . Not on file   Social History Narrative  . No narrative on file    REVIEW OF SYSTEMS: Constitutional: No fevers, chills, or sweats, no generalized fatigue, change in appetite Eyes: No visual changes, double vision, eye pain Ear, nose and throat: No hearing loss, ear pain, nasal congestion, sore throat Cardiovascular: No chest pain, palpitations Respiratory:  No shortness  of breath at rest or with exertion, wheezes GastrointestinaI: No nausea, vomiting, diarrhea, abdominal pain, fecal incontinence Genitourinary:  No dysuria, urinary retention or frequency Musculoskeletal:  No neck pain, occl low back pain Integumentary: No rash, pruritus, skin lesions Neurological: as above Psychiatric: No depression, insomnia, anxiety Endocrine: No palpitations, fatigue, diaphoresis, mood swings, change in appetite, change in weight, increased thirst Hematologic/Lymphatic:  No anemia, purpura, petechiae. Allergic/Immunologic: no itchy/runny eyes, nasal congestion, recent allergic reactions, rashes  PHYSICAL EXAM: Filed Vitals:   01/14/14 0759  BP: 88/60  Pulse: 56  Resp: 14   General: No acute distress Head:  Normocephalic/atraumatic Eyes: Fundoscopic exam shows bilateral sharp discs, no vessel changes, exudates, or hemorrhages Neck: supple, no paraspinal tenderness, full range of motion Back: No paraspinal tenderness Heart: regular rate and rhythm Lungs: Clear to auscultation bilaterally. Vascular: No carotid bruits. Skin/Extremities: No rash, no edema Neurological Exam: Mental status: alert and oriented to person, place, and time, no dysarthria or aphasia, Fund of knowledge is appropriate.  Recent and remote memory are intact.  Attention and concentration are normal.    Able to name objects and repeat phrases. Cranial nerves: CN I: not tested CN II: pupils equal, round and reactive to light, visual fields intact, fundi unremarkable. CN III, IV, VI:  full range of motion, no nystagmus, no ptosis CN V: facial sensation intact CN VII: upper and lower face symmetric CN VIII: hearing intact to finger rub CN IX, X: gag intact, uvula midline CN XI: sternocleidomastoid and trapezius muscles intact CN XII: tongue midline Bulk & Tone: normal, no fasciculations. Motor: 4/5 left ABP, otherwise 5/5 throughout with no pronator drift. Sensation: intact to light touch,  cold, pin, vibration and joint position sense. She felt increased intensity of paresthesias in the left upper arm when pectoralis reflex was checked.  No extinction to double simultaneous stimulation.  Romberg test negative Deep Tendon Reflexes: +2 throughout, no ankle clonus, negative Hoffman sign Plantar responses: mute on left, downgoing on right Cerebellar: no incoordination on finger to nose, heel to shin. No dysdiadochokinesia Gait: narrow-based and steady, able to tandem walk adequately. Tremor: none  IMPRESSION: This is a very pleasant 43 year old right-handed woman with no significant vascular risk factors, presenting for left arm paresthesias and weakness for the past month. Symptoms remain constant, exam shows decreased left hand strength, intact sensation to all modalities.  She was admitted for a stroke workup which was unremarkable, MRI brain without contrast negative for acute infarct. We discussed that a very small stroke can have normal imaging, however symptoms suggestive of peripheral etiology such as radiculopathy.  In this age group, demyelinating disease is also consideration. MRI brain with contrast and MRI cervical spine with and without contrast will be ordered. Additional bloodwork for ESR, ANA, ACE level, and vitamin D level will be ordered. If imaging is unrevealing, she will  be scheduled for an EMG/NCV of the left upper extremity. Findings were discussed with the patient, continue aspirin for now, no need to start lipid-lowering agent as LDL is <100.  She will follow-up after the tests and knows to go to the ER if symptoms change or progress.  Thank you for allowing me to participate in the care of this patient. Please do not hesitate to call for any questions or concerns.   Ellouise Newer, M.D.  CC: Dr. Ileene Rubens

## 2014-01-14 NOTE — Addendum Note (Signed)
Addended by: Thurmon Fair on: 01/14/2014 11:06 AM   Modules accepted: Orders

## 2014-01-14 NOTE — Telephone Encounter (Signed)
Lmom to return my call.

## 2014-01-14 NOTE — Telephone Encounter (Signed)
Patient returned my call. Will send lab order in the mail to her and she will go to [facility] for blood draw.

## 2014-01-14 NOTE — Patient Instructions (Signed)
1. MRI brain with contrast 2. MRI cervical spine with and without contrast 3. Continue daily baby aspirin 4. If symptoms progress, go to ER immediately 5. Follow-up in 1 month

## 2014-01-23 ENCOUNTER — Ambulatory Visit: Admission: RE | Admit: 2014-01-23 | Payer: PRIVATE HEALTH INSURANCE | Source: Ambulatory Visit

## 2014-01-23 LAB — SEDIMENTATION RATE: SED RATE: 4 mm/h (ref 0–22)

## 2014-01-24 ENCOUNTER — Other Ambulatory Visit: Payer: Self-pay | Admitting: Family Medicine

## 2014-01-24 DIAGNOSIS — R209 Unspecified disturbances of skin sensation: Secondary | ICD-10-CM

## 2014-01-24 LAB — ANA: Anti Nuclear Antibody(ANA): NEGATIVE

## 2014-01-24 LAB — VITAMIN D 25 HYDROXY (VIT D DEFICIENCY, FRACTURES): Vit D, 25-Hydroxy: 18 ng/mL — ABNORMAL LOW (ref 30–89)

## 2014-01-24 LAB — ANGIOTENSIN CONVERTING ENZYME: Angiotensin-Converting Enzyme: 17 U/L (ref 8–52)

## 2014-01-24 MED ORDER — DIAZEPAM 5 MG PO TABS: ORAL_TABLET | ORAL | Status: DC

## 2014-01-26 ENCOUNTER — Encounter: Payer: Self-pay | Admitting: Family Medicine

## 2014-01-26 DIAGNOSIS — E559 Vitamin D deficiency, unspecified: Secondary | ICD-10-CM | POA: Insufficient documentation

## 2014-02-02 ENCOUNTER — Ambulatory Visit
Admission: RE | Admit: 2014-02-02 | Discharge: 2014-02-02 | Disposition: A | Discharge status: Home / Self Care | Payer: PRIVATE HEALTH INSURANCE | Source: Ambulatory Visit | Attending: Neurology | Admitting: Neurology

## 2014-02-02 DIAGNOSIS — R209 Unspecified disturbances of skin sensation: Secondary | ICD-10-CM

## 2014-02-02 MED ORDER — GADOBENATE DIMEGLUMINE 529 MG/ML IV SOLN
12.0000 mL | Freq: Once | INTRAVENOUS | Status: AC | PRN
  Administered 2014-02-02: 12 mL via INTRAVENOUS

## 2014-02-04 ENCOUNTER — Telehealth: Payer: Self-pay | Admitting: Family Medicine

## 2014-02-04 DIAGNOSIS — R202 Paresthesia of skin: Principal | ICD-10-CM

## 2014-02-04 DIAGNOSIS — R200 Anesthesia of skin: Secondary | ICD-10-CM

## 2014-02-04 NOTE — Telephone Encounter (Signed)
Message copied by Thurmon Fair on Fri Feb 04, 2014  3:10 PM ------      Message from: Cameron Sprang      Created: Fri Feb 04, 2014  9:16 AM       Please let patient know I reviewed MRI brain and cervical spine, which are normal.  Would proceed with nerve test of the left upper extremity, pls schedule with Dr. Posey Pronto for EMG/NCV left UE. Thanks ------

## 2014-02-14 ENCOUNTER — Ambulatory Visit: Payer: PRIVATE HEALTH INSURANCE | Admitting: Neurology

## 2014-02-22 ENCOUNTER — Encounter: Payer: Self-pay | Admitting: Neurology

## 2014-02-22 ENCOUNTER — Ambulatory Visit (INDEPENDENT_AMBULATORY_CARE_PROVIDER_SITE_OTHER): Payer: PRIVATE HEALTH INSURANCE | Admitting: Neurology

## 2014-02-22 VITALS — BP 112/74 | HR 81 | Resp 16 | Ht 65.0 in | Wt 140.0 lb

## 2014-02-22 DIAGNOSIS — R209 Unspecified disturbances of skin sensation: Secondary | ICD-10-CM

## 2014-02-22 DIAGNOSIS — R202 Paresthesia of skin: Principal | ICD-10-CM

## 2014-02-22 DIAGNOSIS — R200 Anesthesia of skin: Secondary | ICD-10-CM

## 2014-02-22 MED ORDER — GABAPENTIN 300 MG PO CAPS: ORAL_CAPSULE | ORAL | Status: DC

## 2014-02-22 NOTE — Progress Notes (Signed)
NEUROLOGY FOLLOW UP OFFICE NOTE  Kathy Blankenship [id number]  HISTORY OF PRESENT ILLNESS: I had the pleasure of seeing Kathy Blankenship in follow-up in the neurology clinic on 02/22/2014.  The patient was last seen a month ago for left arm numbness and weakness.  MRI brain with and without contrast normal, no evidence of demyelination.  I personally reviewed MRI cervical spine which was normal.  She is scheduled for an EMG/NCV of the left arm. She reports that the left arm tingling and sensation of discomfort comes and goes. She would occasionally have a 1.5 over 10 sharp pain along her left arm, no associated headache, dizziness, or weakness. She avoids lying on her left side and carrying anything with her left arm. Bloodwork showed normal ANA, ESR, ACE level.  Her vitamin D level was low at 18.  HPI:  This is a very pleasant 43 yo RH woman with no significant past medical history, in her usual state of health until 12/14/2013 when she was admitted at [facility] for left arm numbness and weakness. She reports that she had been having nasal and chest congestion for a couple of weeks, woke up at 3am that morning watching TV when she started noticing tingling and numbness on the left forearm. She also noticed mild 3/10 pricking pain on the dorsum of her left hand. The paresthesias started to go up to the left shoulder, and she started feeling discomfort in the arm and weakness where she could not hold up the arm for prolonged periods. Face and leg were unaffected. Right side was unaffected. She denied any associated headache, dizziness, diplopia, dysarthria, dysphagia, neck pain. Symptoms improved over 4 hours, however she continued to have low-grade paresthesias that she rates as a 2/10 on the left arm. She underwent a TIA workup, MRI brain without contrast reported as normal. Echocardiogram and carotid dopplers normal. Her lipid panel showed an LDL of 82. HbA1c 5.4, TSH 4.10, B12 461. She was  discharged home on baby aspirin and low dose statin, however she did not want to take the statin.   PAST MEDICAL HISTORY: Past Medical History  Diagnosis Date  . HSV-2 (herpes simplex virus 2) infection   . Fibroids   . Left ovarian cyst     MEDICATIONS: Current Outpatient Prescriptions on File Prior to Visit  Medication Sig Dispense Refill  . aspirin EC 325 MG tablet Take 1 tablet (325 mg total) by mouth daily.  30 tablet  0   No current facility-administered medications on file prior to visit.    ALLERGIES: No Known Allergies  FAMILY HISTORY: Family History  Problem Relation Age of Onset  . Rheum arthritis Father   . Heart attack Father   . Hypertension Mother     SOCIAL HISTORY: History   Social History  . Marital Status: Divorced    Spouse Name: N/A    Number of Children: N/A  . Years of Education: N/A   Occupational History  . Not on file.   Social History Main Topics  . Smoking status: Never Smoker   . Smokeless tobacco: Never Used  . Alcohol Use: No  . Drug Use: No  . Sexual Activity: Yes    Partners: Male    Birth Control/ Protection: Condom, Pill     Comment: zenchent fe   Other Topics Concern  . Not on file   Social History Narrative  . No narrative on file    REVIEW OF SYSTEMS: Constitutional: No fevers, chills,  or sweats, no generalized fatigue, change in appetite Eyes: No visual changes, double vision, eye pain Ear, nose and throat: No hearing loss, ear pain, nasal congestion, sore throat Cardiovascular: No chest pain, palpitations Respiratory:  No shortness of breath at rest or with exertion, wheezes GastrointestinaI: No nausea, vomiting, diarrhea, abdominal pain, fecal incontinence Genitourinary:  No dysuria, urinary retention or frequency Musculoskeletal:  No neck pain, back pain Integumentary: No rash, pruritus, skin lesions Neurological: as above Psychiatric: No depression, insomnia, anxiety Endocrine: No palpitations, fatigue,  diaphoresis, mood swings, change in appetite, change in weight, increased thirst Hematologic/Lymphatic:  No anemia, purpura, petechiae. Allergic/Immunologic: no itchy/runny eyes, nasal congestion, recent allergic reactions, rashes  PHYSICAL EXAM: Filed Vitals:   02/22/14 1040  BP: 112/74  Pulse: 81  Resp: 16   General: No acute distress Head:  Normocephalic/atraumatic Neck: supple, no paraspinal tenderness, full range of motion Heart:  Regular rate and rhythm Lungs:  Clear to auscultation bilaterally Back: No paraspinal tenderness Skin/Extremities: No rash, no edema Neurological Exam: alert and oriented to person, place, and time. No aphasia or dysarthria. Fund of knowledge is appropriate.  Recent and remote memory are intact.  Attention and concentration are normal.    Able to name objects and repeat phrases. Cranial nerves: Pupils equal, round, reactive to light.  Fundoscopic exam unremarkable, no papilledema. Extraocular movements intact with no nystagmus. Visual fields full. Facial sensation intact. No facial asymmetry. Tongue, uvula, palate midline.  Motor: Bulk and tone normal, muscle strength 5/5 throughout with no pronator drift.  Sensation to light touch, temperature and vibration intact.  No extinction to double simultaneous stimulation.  Deep tendon reflexes 2+ throughout, toes downgoing.  Finger to nose testing intact.  Gait narrow-based and steady, able to tandem walk adequately.  Romberg negative. +Tinel's sign at the left elbow.  IMPRESSION: This is a very pleasant 43 yo RH woman with no significant vascular risk factors with intermittent left arm paresthesias and weakness.  MRI brain and cervical spine normal.  Symptoms suggestive of peripheral etiology such as radiculopathy/neuropathy. She is scheduled for EMG/NCV of the left UE.  We discussed starting physical therapy and symptomatic treatment with membrane stabilizing agents such as gabapentin. Side effects were discussed, she  will start low dose 369m qhs x 2 weeks, then increase to 3070mBID.  Continue daily aspirin. Note of low vitamin D level, she has been advised to speak to her PCP for replacement therapy.  She knows to go to the ER if symptoms change or progress. Follow-up after EMG and PT.    Thank you for allowing me to participate in her care.  Please do not hesitate to call for any questions or concerns.  The duration of this appointment visit was 25 minutes of face-to-face time with the patient.  Greater than 50% of this time was spent in counseling, explanation of diagnosis, planning of further management, and coordination of care.   KaEllouise NewerM.D.   CC: Dr. HoIleene Rubens

## 2014-02-22 NOTE — Addendum Note (Signed)
Addended by: Thurmon Fair on: 02/22/2014 12:41 PM   Modules accepted: Orders

## 2014-02-22 NOTE — Patient Instructions (Signed)
1. Proceed with EMG/NCV of the left arm as scheduled 2. Start gabapentin 349m: Take 1 cap at bedtime for 2 weeks, then increase to 1 cap twice a day 3. Refer to physical therapy for left arm radiculopathy 4. Follow-up after EMG and PT

## 2014-02-25 ENCOUNTER — Telehealth: Payer: Self-pay | Admitting: Family Medicine

## 2014-02-25 NOTE — Telephone Encounter (Signed)
I have called patient twice & left voicemail's. Awaiting patient's return to call to follow-up about Vitamin replacement from her pcp.

## 2014-02-25 NOTE — Telephone Encounter (Signed)
Message copied by Thurmon Fair on Fri Feb 25, 2014  9:20 AM ------      Message from: Cameron Sprang      Created: Tue Feb 22, 2014 11:23 AM      Regarding: pt fyi       Pls let her know gabapentin sent to her PCP, pls refer to PT. Also, her vitamin D level was low, has she spoken to Dr. Ileene Rubens for vitamin D replacement? Thanks ------

## 2014-03-24 ENCOUNTER — Telehealth: Payer: Self-pay | Admitting: Family Medicine

## 2014-03-24 NOTE — Telephone Encounter (Signed)
Left msg on patient's voicemail asking her to return my call. Need to speak with her about physical therapy.

## 2014-03-28 ENCOUNTER — Encounter: Payer: PRIVATE HEALTH INSURANCE | Admitting: Neurology

## 2014-04-06 ENCOUNTER — Ambulatory Visit (INDEPENDENT_AMBULATORY_CARE_PROVIDER_SITE_OTHER): Payer: PRIVATE HEALTH INSURANCE | Admitting: Neurology

## 2014-04-06 DIAGNOSIS — R200 Anesthesia of skin: Secondary | ICD-10-CM

## 2014-04-06 DIAGNOSIS — R202 Paresthesia of skin: Principal | ICD-10-CM

## 2014-04-06 NOTE — Procedures (Signed)
[facility]  [place], [place]  [place], [place] [address] Tel: [phone] Fax:  [phone] Test Date:  04/06/2014  Patient: Kathy Blankenship DOB: Aug 16, 1970 Physician: Narda Amber, DO  Sex: Female Height: 5' 5"  Ref Phys: Ellouise Newer  ID#: [id number] Temp: 32.8C Technician:    Patient Complaints: This is a 43 year-old female presenting for evaluation of left arm paresthesias and weakness.  NCV & EMG Findings: Extensive electrodiagnostic testing of the left upper extremity shows:  1. Left median, ulnar, radial, dorsal ulnar cutaneous, and palmar studies are within normal limits. 2. Left median motor responses within normal limits. The left ulnar motor nerve showed decreased conduction velocity (A Elbow-B Elbow, 48 m/s).  3. There is no evidence of active or chronic motor axon loss changes affecting any of the tested muscles. Motor unit configuration and recruitment pattern is within normal limits.  Impression: 1. Left ulnar neuropathy with slowing across the elbow, purely demyelinating in type. Overall, these findings are mild in degree electrically. 2. There is no evidence of carpal tunnel syndrome or a cervical radiculopathy affecting the left upper extremity.  ___________________________ Narda Amber, DO    Nerve Conduction Studies Anti Sensory Summary Table   Site NR Peak (ms) Norm Peak (ms) P-T Amp (V) Norm P-T Amp  Left DorsCutan Anti Sensory (Dorsum 5th MC)  Wrist    1.9 <3.1 20.2 >12  Left Median Anti Sensory (2nd Digit)  Wrist    2.5 <3.4 67.4 >20  Left Radial Anti Sensory (Base 1st Digit)  Wrist    2.2 <2.7 21.3 >18  Left Ulnar Anti Sensory (5th Digit)  Wrist    3.0 <3.1 49.2 >12   Motor Summary Table   Site NR Onset (ms) Norm Onset (ms) O-P Amp (mV) Norm O-P Amp Site1 Site2 Delta-0 (ms) Dist (cm) Vel (m/s) Norm Vel (m/s)  Left Median Motor (Abd Poll Brev)  Wrist    2.8 <3.9 14.2 >6 Elbow Wrist 4.2 27.0 64 >50  Elbow    7.0  13.5           Left Ulnar Motor (Abd Dig Minimi)  Wrist    2.6 <3.1 10.0 >7 B Elbow Wrist 3.3 23.5 71 >50  B Elbow    5.9  9.7  A Elbow B Elbow 2.1 10.0 48 >50  A Elbow    8.0  9.0         Left Ulnar (FDI) Motor (1st DI)  Wrist    3.6 <4.3 17.4 >7         Comparison Summary Table   Site NR Peak (ms) Norm Peak (ms) P-T Amp (V) Site1 Site2 Delta-P (ms) Norm Delta (ms)  Left Median/Ulnar Palm Comparison (Wrist - 8cm)  Median Palm    1.5 <2.2 78.4 Median Palm Ulnar Palm 0.2   Ulnar Palm    1.7 <2.2 37.8       EMG   Side Muscle Ins Act Fibs Psw Fasc Number Recrt Dur Dur. Amp Amp. Poly Poly. Comment  Left 1stDorInt Nml Nml Nml Nml Nml Nml Nml Nml Nml Nml Nml Nml N/A  Left Ext Indicis Nml Nml Nml Nml Nml Nml Nml Nml Nml Nml Nml Nml N/A  Left ABD Dig Min Nml Nml Nml Nml Nml Nml Nml Nml Nml Nml Nml Nml N/A  Left FlexDigProf 4,5 Nml Nml Nml Nml Nml Nml Nml Nml Nml Nml Nml Nml N/A  Left PronatorTeres Nml Nml Nml Nml Nml Nml Nml Nml Nml Nml  Nml Nml N/A  Left Biceps Nml Nml Nml Nml Nml Nml Nml Nml Nml Nml Nml Nml N/A  Left Triceps Nml Nml Nml Nml Nml Nml Nml Nml Nml Nml Nml Nml N/A  Left Deltoid Nml Nml Nml Nml Nml Nml Nml Nml Nml Nml Nml Nml N/A      Waveforms:

## 2014-04-06 NOTE — Patient Instructions (Signed)
After an EMG, you may be sore and have a tingling feeling in your muscles for several hours following the testing. You may have small bruises or swelling at the needle site.  If so, apply ice or take Tylenol for the discomfort.

## 2014-04-12 ENCOUNTER — Telehealth: Payer: Self-pay | Admitting: Neurology

## 2014-04-12 NOTE — Telephone Encounter (Signed)
Left VM for patient to callback re: EMG results.

## 2014-04-13 ENCOUNTER — Encounter: Payer: Self-pay | Admitting: Family Medicine

## 2014-04-13 ENCOUNTER — Ambulatory Visit (INDEPENDENT_AMBULATORY_CARE_PROVIDER_SITE_OTHER): Payer: PRIVATE HEALTH INSURANCE | Admitting: Family Medicine

## 2014-04-13 VITALS — BP 129/84 | HR 81 | Ht 65.0 in | Wt 140.0 lb

## 2014-04-13 DIAGNOSIS — E785 Hyperlipidemia, unspecified: Secondary | ICD-10-CM

## 2014-04-13 DIAGNOSIS — E559 Vitamin D deficiency, unspecified: Secondary | ICD-10-CM

## 2014-04-13 DIAGNOSIS — L309 Dermatitis, unspecified: Secondary | ICD-10-CM

## 2014-04-13 DIAGNOSIS — Z0000 Encounter for general adult medical examination without abnormal findings: Secondary | ICD-10-CM

## 2014-04-13 MED ORDER — VITAMIN D (ERGOCALCIFEROL) 1.25 MG (50000 UNIT) PO CAPS: 50000.0000 [IU] | ORAL_CAPSULE | ORAL | Status: DC

## 2014-04-13 MED ORDER — TRIAMCINOLONE ACETONIDE 0.1 % EX CREA: TOPICAL_CREAM | CUTANEOUS | Status: DC

## 2014-04-13 NOTE — Telephone Encounter (Signed)
EMG/NCV results discussed, no evidence of radiculopathy. There is mild left ulnar neuropathy across the elbow, likely the cause of her arm pain and numbness. Treatment discussed with the patient, particularly avoidance of compression, and using elbow splint during the daytime. She expressed understanding and will follow-up as scheduled.

## 2014-04-13 NOTE — Patient Instructions (Signed)
Dr. Lajoyce Lauber General Advice Following Your Complete Physical Exam  The Benefits of Regular Exercise: Unless you suffer from an uncontrolled cardiovascular condition, studies strongly suggest that regular exercise and physical activity will add to both the quality and length of your life.  The World Health Organization recommends 150 minutes of moderate intensity aerobic activity every week.  This is best split over 3-4 days a week, and can be as simple as a brisk walk for just over 35 minutes "most days of the week".  This type of exercise has been shown to lower LDL-Cholesterol, lower average blood sugars, lower blood pressure, lower cardiovascular disease risk, improve memory, and increase one's overall sense of wellbeing.  The addition of anaerobic (or "strength training") exercises offers additional benefits including but not limited to increased metabolism, prevention of osteoporosis, and improved overall cholesterol levels.  How Can I Strive For A Low-Fat Diet?: Current guidelines recommend that 25-35 percent of your daily energy (food) intake should come from fats.  One might ask how can this be achieved without having to dissect each meal on a daily basis?  Switch to skim or 1% milk instead of whole milk.  Focus on lean meats such as ground [place], fresh fish, baked chicken, and lean cuts of beef as your source of dietary protein.  Limit saturated fat consumption to less than 10% of your daily caloric intake.  Limit trans fatty acid consumption primarily by limiting synthetic trans fats such as partially hydrogenated oils (Ex: fried fast foods).  Substitute olive or vegetable oil for solid fats where possible.  Moderation of Salt Intake: Provided you don't carry a diagnosis of congestive heart failure nor renal failure, I recommend a daily allowance of no more than 2300 mg of salt (sodium).  Keeping under this daily goal is associated with a decreased risk of cardiovascular events, creeping  above it can lead to elevated blood pressures and increases your risk of cardiovascular events.  Milligrams (mg) of salt is listed on all nutrition labels, and your daily intake can add up faster than you think.  Most canned and frozen dinners can pack in over half your daily salt allowance in one meal.    Lifestyle Health Risks: Certain lifestyle choices carry specific health risks.  As you may already know, tobacco use has been associated with increasing one's risk of cardiovascular disease, pulmonary disease, numerous cancers, among many other issues.  What you may not know is that there are medications and nicotine replacement strategies that can more than double your chances of successfully quitting.  I would be thrilled to help manage your quitting strategy if you currently use tobacco products.  When it comes to alcohol use, I've yet to find an "ideal" daily allowance.  Provided an individual does not have a medical condition that is exacerbated by alcohol consumption, general guidelines determine "safe drinking" as no more than two standard drinks for a man or no more than one standard drink for a female per day.  However, much debate still exists on whether any amount of alcohol consumption is technically "safe".  My general advice, keep alcohol consumption to a minimum for general health promotion.  If you or others believe that alcohol, tobacco, or recreational drug use is interfering with your life, I would be happy to provide confidential counseling regarding treatment options.  General "Over The Counter" Nutrition Advice: Postmenopausal women should aim for a daily calcium intake of 1200 mg, however a significant portion of this might already be  provided by diets including milk, yogurt, cheese, and other dairy products.  Vitamin D has been shown to help preserve bone density, prevent fatigue, and has even been shown to help reduce falls in the elderly.  Ensuring a daily intake of 800 Units of  Vitamin D is a good place to start to enjoy the above benefits, we can easily check your Vitamin D level to see if you'd potentially benefit from supplementation beyond 800 Units a day.  Folic Acid intake should be of particular concern to women of childbearing age.  Daily consumption of 387-564 mcg of Folic Acid is recommended to minimize the chance of spinal cord defects in a fetus should pregnancy occur.    For many adults, accidents still remain one of the most common culprits when it comes to cause of death.  Some of the simplest but most effective preventitive habits you can adopt include regular seatbelt use, proper helmet use, securing firearms, and regularly testing your smoke and carbon monoxide detectors.  Demetre Monaco B. [address], [place], [place] [address] Phone: [phone]

## 2014-04-13 NOTE — Progress Notes (Signed)
CC: Kathy Blankenship is a 43 y.o. female is here for Annual Exam   Subjective: HPI:  Colonoscopy:  No family history of colon cancer will begin screening at age 43 Papsmear:  It's been over 3 years since her last mammogram, she declines having it done today but will reschedule it in the near future. Mammogram: Declined today after discussing risks and benefits of screening at age 27 versus 37. She's may do some thinking about risks and benefits before getting a mammogram  Influenza Vaccine:received this season Pneumovax: no current indication Td/Tdap: Tdap 2015 Zoster: (Start 41 yo)  Presents for complete physical exam without any acute complaints other than itching on the anterior aspects of both legs. Symptoms are mild to moderate in severity and occurs every winter this time of the year. Slight benefit from lotions but still inferior with quality of life.  No alcohol use tobacco use or recreational drug use  Review of Systems - General ROS: negative for - chills, fever, night sweats, weight gain or weight loss Ophthalmic ROS: negative for - decreased vision Psychological ROS: negative for - anxiety or depression ENT ROS: negative for - hearing change, nasal congestion, tinnitus or allergies Hematological and Lymphatic ROS: negative for - bleeding problems, bruising or swollen lymph nodes Breast ROS: negative Respiratory ROS: no cough, shortness of breath, or wheezing Cardiovascular ROS: no chest pain or dyspnea on exertion Gastrointestinal ROS: no abdominal pain, change in bowel habits, or black or bloody stools Genito-Urinary ROS: negative for - genital discharge, genital ulcers, incontinence or abnormal bleeding from genitals Musculoskeletal ROS: negative for - joint pain or muscle pain Neurological ROS: negative for - headaches or memory loss Dermatological ROS: negative for lumps, mole changes, rash and skin lesion changes    Past Medical History  Diagnosis Date  . HSV-2  (herpes simplex virus 2) infection   . Fibroids   . Left ovarian cyst     Past Surgical History  Procedure Laterality Date  . Mole removal  1995  . Colonoscopy  2006   Family History  Problem Relation Age of Onset  . Rheum arthritis Father   . Heart attack Father   . Hypertension Mother     History   Social History  . Marital Status: Divorced    Spouse Name: N/A    Number of Children: N/A  . Years of Education: N/A   Occupational History  . Not on file.   Social History Main Topics  . Smoking status: Never Smoker   . Smokeless tobacco: Never Used  . Alcohol Use: No  . Drug Use: No  . Sexual Activity:    Partners: Male    Birth Control/ Protection: Condom, Pill     Comment: zenchent fe   Other Topics Concern  . Not on file   Social History Narrative     Objective: BP 129/84 mmHg  Pulse 81  Ht 5' 5"  (1.651 m)  Wt 140 lb (63.504 kg)  BMI 23.30 kg/m2  General: No Acute Distress HEENT: Atraumatic, normocephalic, conjunctivae normal without scleral icterus.  No nasal discharge, hearing grossly intact, TMs with good landmarks bilaterally with no middle ear abnormalities, posterior pharynx clear without oral lesions. Neck: Supple, trachea midline, no cervical nor supraclavicular adenopathy. Pulmonary: Clear to auscultation bilaterally without wheezing, rhonchi, nor rales. Cardiac: Regular rate and rhythm.  No murmurs, rubs, nor gallops. No peripheral edema.  2+ peripheral pulses bilaterally. Abdomen: Bowel sounds normal.  No masses.  Non-tender without rebound.  Negative Murphy's  sign. GU: patient declined today MSK: Grossly intact, no signs of weakness.  Full strength throughout upper and lower extremities.  Full ROM in upper and lower extremities.  No midline spinal tenderness. Neuro: Gait unremarkable, CN II-XII grossly intact.  C5-C6 Reflex 2/4 Bilaterally, L4 Reflex 2/4 Bilaterally.  Cerebellar function intact. Skin: No rashes. She's got a seborrheic keratosis  on the left ankle and 3 on the right ankle. Mild excoriations on the right and left chin, moderately dry skin on the anterior legs Psych: Alert and oriented to person/place/time.  Thought process normal. No anxiety/depression.  Assessment & Plan: Kathy Blankenship was seen today for annual exam.  Diagnoses and associated orders for this visit:  Annual physical exam - COMPLETE METABOLIC PANEL WITH GFR - CBC  Hyperlipidemia - COMPLETE METABOLIC PANEL WITH GFR - CBC  Vitamin D deficiency - Vitamin D, Ergocalciferol, (DRISDOL) 50000 UNITS CAPS capsule; Take 1 capsule (50,000 Units total) by mouth every 7 (seven) days. Recheck Vitamin D in 3 Months  Eczema - triamcinolone cream (KENALOG) 0.1 %; Apply to affected areas twice a day for up to two weeks, avoid face.    Healthy lifestyle interventions including but not limited to regular exercise, a healthy low fat diet, moderation of salt intake, the dangers of tobacco/alcohol/recreational drug use, nutrition supplementation, and accident avoidance were discussed with the patient and a handout was provided for future reference.  She had a lipid panel in July which was completely normal therefore we are not to get another one today  She was found to have vitamin D deficiency at her neurologist office and will begin vitamin D supplementation of asked her to return for lab only visit in 3 months  Eczema: Start triamcinolone cream  Return for As needed and for annual physicals.

## 2014-04-14 LAB — CBC
HCT: 41 % (ref 36.0–46.0)
Hemoglobin: 13.8 g/dL (ref 12.0–15.0)
MCH: 28.2 pg (ref 26.0–34.0)
MCHC: 33.7 g/dL (ref 30.0–36.0)
MCV: 83.7 fL (ref 78.0–100.0)
MPV: 10.4 fL (ref 9.4–12.4)
Platelets: 248 10*3/uL (ref 150–400)
RBC: 4.9 MIL/uL (ref 3.87–5.11)
RDW: 13.4 % (ref 11.5–15.5)
WBC: 4.8 10*3/uL (ref 4.0–10.5)

## 2014-04-14 LAB — COMPLETE METABOLIC PANEL WITH GFR
ALBUMIN: 4.7 g/dL (ref 3.5–5.2)
ALT: 13 U/L (ref 0–35)
AST: 16 U/L (ref 0–37)
Alkaline Phosphatase: 58 U/L (ref 39–117)
BILIRUBIN TOTAL: 0.8 mg/dL (ref 0.2–1.2)
BUN: 12 mg/dL (ref 6–23)
CO2: 27 meq/L (ref 19–32)
Calcium: 9.8 mg/dL (ref 8.4–10.5)
Chloride: 105 mEq/L (ref 96–112)
Creat: 0.66 mg/dL (ref 0.50–1.10)
GLUCOSE: 77 mg/dL (ref 70–99)
POTASSIUM: 4.1 meq/L (ref 3.5–5.3)
Sodium: 141 mEq/L (ref 135–145)
TOTAL PROTEIN: 8 g/dL (ref 6.0–8.3)

## 2014-05-31 ENCOUNTER — Encounter: Payer: Self-pay | Admitting: Family Medicine

## 2014-05-31 ENCOUNTER — Other Ambulatory Visit (HOSPITAL_COMMUNITY)
Admission: RE | Admit: 2014-05-31 | Discharge: 2014-05-31 | Disposition: A | Discharge status: Home / Self Care | Payer: PRIVATE HEALTH INSURANCE | Source: Ambulatory Visit | Attending: Family Medicine | Admitting: Family Medicine

## 2014-05-31 ENCOUNTER — Ambulatory Visit (INDEPENDENT_AMBULATORY_CARE_PROVIDER_SITE_OTHER): Payer: PRIVATE HEALTH INSURANCE | Admitting: Family Medicine

## 2014-05-31 VITALS — BP 118/71 | HR 72 | Wt 143.0 lb

## 2014-05-31 DIAGNOSIS — Z01419 Encounter for gynecological examination (general) (routine) without abnormal findings: Secondary | ICD-10-CM | POA: Diagnosis not present

## 2014-05-31 DIAGNOSIS — Z1151 Encounter for screening for human papillomavirus (HPV): Secondary | ICD-10-CM | POA: Diagnosis present

## 2014-05-31 DIAGNOSIS — Z124 Encounter for screening for malignant neoplasm of cervix: Secondary | ICD-10-CM

## 2014-05-31 DIAGNOSIS — Z3009 Encounter for other general counseling and advice on contraception: Secondary | ICD-10-CM

## 2014-05-31 DIAGNOSIS — N926 Irregular menstruation, unspecified: Secondary | ICD-10-CM

## 2014-05-31 DIAGNOSIS — Z309 Encounter for contraceptive management, unspecified: Secondary | ICD-10-CM

## 2014-05-31 LAB — TSH: TSH: 2.508 u[IU]/mL (ref 0.350–4.500)

## 2014-05-31 NOTE — Progress Notes (Signed)
CC: Kathy Blankenship is a 44 y.o. female is here for pap only   Subjective: HPI:  It's been greater than 3 years since she had a Pap smear. She denies any known abnormal Pap smears in the past. She's never been pregnant nor has she ever had any gynecological surgery done.  She reports that her last menstrual cycle was back in November, over the past year it's been predictable every month however she is at least 2 or 3 weeks late now. She's been sexually active without any protection. She reports what she describes as hot flashes over the last 2 weeks but no other symptoms at with suggestion is pregnant.  She reports in the past that she's had unpredictable periods that began like a situation she is experiencing now that ultimately required oral estrogen and progesterone.   Review Of Systems Outlined In HPI  Past Medical History  Diagnosis Date  . HSV-2 (herpes simplex virus 2) infection   . Fibroids   . Left ovarian cyst     Past Surgical History  Procedure Laterality Date  . Mole removal  1995  . Colonoscopy  2006   Family History  Problem Relation Age of Onset  . Rheum arthritis Father   . Heart attack Father   . Hypertension Mother     History   Social History  . Marital Status: Divorced    Spouse Name: N/A    Number of Children: N/A  . Years of Education: N/A   Occupational History  . Not on file.   Social History Main Topics  . Smoking status: Never Smoker   . Smokeless tobacco: Never Used  . Alcohol Use: No  . Drug Use: No  . Sexual Activity:    Partners: Male    Birth Control/ Protection: Condom, Pill     Comment: zenchent fe   Other Topics Concern  . Not on file   Social History Narrative     Objective: BP 118/71 mmHg  Pulse 72  Wt 143 lb (64.864 kg)  LMP 02/28/2014 (Approximate)  Vital signs reviewed. General: Alert and Oriented, No Acute Distress HEENT: Pupils equal, round, reactive to light. Conjunctivae clear.  External ears unremarkable.   Moist mucous membranes. Lungs: Clear and comfortable work of breathing, speaking in full sentences without accessory muscle use. Cardiac: Regular rate and rhythm.  Genitourinary: Distal urethra unremarkable, external vaginal orifice unremarkable, vaginal canal without lesions or gross abnormalities. Cervix is unremarkable without gross abnormalities. No bleeding from the cervical os. Extremities: No peripheral edema.  Strong peripheral pulses.  Mental Status: No depression, anxiety, nor agitation. Logical though process. Skin: Warm and dry.  Assessment & Plan: Kathy Blankenship was seen today for pap only.  Diagnoses and associated orders for this visit:  Missed period - B-HCG Quant - TSH  Screening for cervical cancer - B-HCG Quant - TSH  Counseling for birth control, oral contraceptives    Missed period: Home urine pregnant test has been negative, checking beta hCG and TSH and if normal along with normal Pap smear will restart her former regimen of Femcon FE for both birth control and regulation of menses.  She reports that energy level has improved ever since taking weekly vitamin D without any known side effects.  Return if symptoms worsen or fail to improve.

## 2014-05-31 NOTE — Addendum Note (Signed)
Addended by: Terance Hart on: 05/31/2014 04:42 PM   Modules accepted: Orders

## 2014-06-01 ENCOUNTER — Telehealth: Payer: Self-pay | Admitting: Family Medicine

## 2014-06-01 LAB — HCG, QUANTITATIVE, PREGNANCY

## 2014-06-01 MED ORDER — NORETHIN-ETH ESTRADIOL-FE 0.4-35 MG-MCG PO CHEW: 1.0000 | CHEWABLE_TABLET | Freq: Every day | ORAL | Status: DC

## 2014-06-01 NOTE — Telephone Encounter (Signed)
Pt.notified

## 2014-06-01 NOTE — Telephone Encounter (Signed)
Seth Bake, Will you please let patient know that her blood work was normal, thyroid function normal and no evidence of pregnancy.  I've sent refills of her Femcon Fe to her [facility] but I wouldn't recommend her picking it up until the results of her pap smear are back.  I sent it over early just to make sure insurance isn't going to make [place] do a PA bring up any other delay.

## 2014-06-03 LAB — CYTOLOGY - PAP

## 2014-06-07 ENCOUNTER — Telehealth: Payer: Self-pay | Admitting: Family Medicine

## 2014-06-07 NOTE — Telephone Encounter (Signed)
Pt.notified

## 2014-06-07 NOTE — Telephone Encounter (Signed)
Kathy Blankenship, Will you please let patient know that her pap smear did not show any abnormality.  She can go ahead and start the femcon FE Rx sent to her [facility] last week.

## 2014-08-15 ENCOUNTER — Encounter: Payer: Self-pay | Admitting: Physician Assistant

## 2014-08-15 ENCOUNTER — Ambulatory Visit (INDEPENDENT_AMBULATORY_CARE_PROVIDER_SITE_OTHER): Payer: PRIVATE HEALTH INSURANCE | Admitting: Physician Assistant

## 2014-08-15 VITALS — BP 118/80 | HR 96 | Temp 98.4°F | Wt >= 6400 oz

## 2014-08-15 DIAGNOSIS — J309 Allergic rhinitis, unspecified: Secondary | ICD-10-CM

## 2014-08-15 MED ORDER — METHYLPREDNISOLONE SODIUM SUCC 125 MG IJ SOLR
125.0000 mg | Freq: Once | INTRAMUSCULAR | Status: AC
  Administered 2014-08-15: 125 mg via INTRAVENOUS

## 2014-08-15 MED ORDER — BECLOMETHASONE DIPROPIONATE 80 MCG/ACT NA AERS: 2.0000 | INHALATION_SPRAY | Freq: Every day | NASAL | Status: DC

## 2014-08-15 NOTE — Progress Notes (Signed)
   Subjective:    Patient ID: Kathy Blankenship, female    DOB: [birth year], 44 y.o.   MRN: [id number]  HPI Patient is a 44 year old female who presents to the clinic with acute sinus pressure, nasal congestion and headache that started yesterday. She does have a history of seasonal allergies and allergic rhinitis however she went 20 allergist earlier this year and did not test positive for any certain allergies. She is not on any daily antihistamines. She has tried [facility] and Nasacort in the past and did not get any results. She denies any watery itchy eyes or ear pain. The pressure improved slightly with Sudafed at about 11:00 this morning. She does have nasal saline that she uses but has not tried recently. She denies any fever, chills, cough, wheezing, shortness of breath.   Review of Systems  All other systems reviewed and are negative.      Objective:   Physical Exam  Constitutional: She is oriented to person, place, and time. She appears well-developed and well-nourished.  HENT:  Head: Normocephalic and atraumatic.  Right Ear: External ear normal.  Left Ear: External ear normal.  Mouth/Throat: Oropharynx is clear and moist. No oropharyngeal exudate.  TMs clear bilaterally.  Nasal turbinates red and swollen mostly on the left but on the right as well.  Tenderness over bilateral maxillary sinuses to palpation.  Eyes: Right eye exhibits no discharge. Left eye exhibits no discharge.  Bilateral conjunctiva slightly injected bilaterally.  Neck: Normal range of motion. Neck supple.  Cardiovascular: Normal rate, regular rhythm and normal heart sounds.   Pulmonary/Chest: Effort normal and breath sounds normal.  Lymphadenopathy:    She has no cervical adenopathy.  Neurological: She is alert and oriented to person, place, and time.  Skin: Skin is dry.  Psychiatric: She has a normal mood and affect. Her behavior is normal.          Assessment & Plan:  Allergic sinusitis- shot of  solumedrol 174m given IM today. Pt did not want to start prednisone orally. Will try qnasl 2 sprays each nostril daily since did not get response from flonase or nasocort. 15 dollar coupon given. Encouraged nasal saline rinses with salt water. Encouraged pt to start zyrtec/claritin daily for help with allergic symptoms reoccuring. Follow up as needed with PCP. Discussed with pt no signs of bacterial infection on exam today.

## 2015-08-09 ENCOUNTER — Encounter: Payer: Self-pay | Admitting: Family Medicine

## 2015-08-09 ENCOUNTER — Ambulatory Visit (INDEPENDENT_AMBULATORY_CARE_PROVIDER_SITE_OTHER): Payer: BLUE CROSS/BLUE SHIELD | Admitting: Family Medicine

## 2015-08-09 VITALS — BP 107/75 | HR 88 | Wt 150.0 lb

## 2015-08-09 DIAGNOSIS — N926 Irregular menstruation, unspecified: Secondary | ICD-10-CM

## 2015-08-09 DIAGNOSIS — Z0000 Encounter for general adult medical examination without abnormal findings: Secondary | ICD-10-CM

## 2015-08-09 DIAGNOSIS — M79659 Pain in unspecified thigh: Secondary | ICD-10-CM

## 2015-08-09 DIAGNOSIS — Z1239 Encounter for other screening for malignant neoplasm of breast: Secondary | ICD-10-CM

## 2015-08-09 NOTE — Progress Notes (Signed)
CC: Kathy Blankenship is a 45 y.o. female is here for Annual Exam   Subjective: HPI:  Colonoscopy: No indication until 44 years of age 88: UTD from 2 years ago Mammogram: She like to start getting these in the 45s referral has been placed to have this done in the near future   \ Influenza Vaccine:declined  Pneumovax: no current indication Td/Tdap: UTD Zoster: No current indication  Requesting complete physical exam.  She tells me since she stopped taking birth control pill she is having completely and predictable periods. She will anywhere from 2 weeks-4 months before having a period. No interventions as of yet.  She's also had some bilateral lateral thigh pain but only present at rest is mild in severity and occurs a few times out of the month. Never with ambulation never with any overlying skin changes or weakness  Review of Systems - General ROS: negative for - chills, fever, night sweats, weight gain or weight loss Ophthalmic ROS: negative for - decreased vision Psychological ROS: negative for - anxiety or depression ENT ROS: negative for - hearing change, nasal congestion, tinnitus or allergies Hematological and Lymphatic ROS: negative for - bleeding problems, bruising or swollen lymph nodes Breast ROS: negative Respiratory ROS: no cough, shortness of breath, or wheezing Cardiovascular ROS: no chest pain or dyspnea on exertion Gastrointestinal ROS: no abdominal pain, change in bowel habits, or black or bloody stools Genito-Urinary ROS: negative for - genital discharge, genital ulcers, incontinence  Musculoskeletal ROS: negative for - joint pain or muscle pain other than that described above Neurological ROS: negative for - headaches or memory loss Dermatological ROS: negative for lumps, mole changes, rash and skin lesion changes  Past Medical History  Diagnosis Date  . HSV-2 (herpes simplex virus 2) infection   . Fibroids   . Left ovarian cyst     Past Surgical History   Procedure Laterality Date  . Mole removal  1995  . Colonoscopy  2006   Family History  Problem Relation Age of Onset  . Rheum arthritis Father   . Heart attack Father   . Hypertension Mother     Social History   Social History  . Marital Status: Married    Spouse Name: N/A  . Number of Children: N/A  . Years of Education: N/A   Occupational History  . Not on file.   Social History Main Topics  . Smoking status: Never Smoker   . Smokeless tobacco: Never Used  . Alcohol Use: No  . Drug Use: No  . Sexual Activity:    Partners: Male    Birth Control/ Protection: Condom, Pill     Comment: zenchent fe   Other Topics Concern  . Not on file   Social History Narrative     Objective: BP 107/75 mmHg  Pulse 88  Wt 150 lb (68.04 kg)  General: No Acute Distress HEENT: Atraumatic, normocephalic, conjunctivae normal without scleral icterus.  No nasal discharge, hearing grossly intact, TMs with good landmarks bilaterally with no middle ear abnormalities, posterior pharynx clear without oral lesions. Neck: Supple, trachea midline, no cervical nor supraclavicular adenopathy. Pulmonary: Clear to auscultation bilaterally without wheezing, rhonchi, nor rales. Cardiac: Regular rate and rhythm.  No murmurs, rubs, nor gallops. No peripheral edema.  2+ peripheral pulses bilaterally. Abdomen: Bowel sounds normal.  No masses.  Non-tender without rebound.  Negative Murphy's sign. MSK: Grossly intact, no signs of weakness.  Full strength throughout upper and lower extremities.  Full ROM in upper and  lower extremities.  No midline spinal tenderness. Neuro: Gait unremarkable, CN II-XII grossly intact.  C5-C6 Reflex 2/4 Bilaterally, L4 Reflex 2/4 Bilaterally.  Cerebellar function intact. Skin: No rashes. Psych: Alert and oriented to person/place/time.  Thought process normal. No anxiety/depression.  Assessment & Plan: Agatha was seen today for annual exam.  Diagnoses and all orders for this  visit:  Annual physical exam -     Lipid panel -     COMPLETE METABOLIC PANEL WITH GFR -     Vitamin D (25 hydroxy) -     CBC -     TSH -     Testosterone  Irregular periods -     TSH -     Testosterone  Pain of thigh, unspecified laterality  Breast cancer screening -     MM DIGITAL SCREENING BILATERAL   Healthy lifestyle interventions including but not limited to regular exercise, a healthy low fat diet, moderation of salt intake, the dangers of tobacco/alcohol/recreational drug use, nutrition supplementation, and accident avoidance were discussed with the patient and a handout was provided for future reference. Screening for thyroid abnormality or PCO S with TSH and testosterone respectively.   Return in about 3 months (around 11/09/2015) for Any remaining symptoms that have persisted. Marland Kitchen

## 2015-08-09 NOTE — Patient Instructions (Signed)
Dr. Lajoyce Blankenship General Advice Following Your Complete Physical Exam  The Benefits of Regular Exercise: Unless you suffer from an uncontrolled cardiovascular condition, studies strongly suggest that regular exercise and physical activity will add to both the quality and length of your life.  The World Health Organization recommends 150 minutes of moderate intensity aerobic activity every week.  This is best split over 3-4 days a week, and can be as simple as a brisk walk for just over 35 minutes "most days of the week".  This type of exercise has been shown to lower LDL-Cholesterol, lower average blood sugars, lower blood pressure, lower cardiovascular disease risk, improve memory, and increase one's overall sense of wellbeing.  The addition of anaerobic (or "strength training") exercises offers additional benefits including but not limited to increased metabolism, prevention of osteoporosis, and improved overall cholesterol levels.  How Can I Strive For A Low-Fat Diet?: Current guidelines recommend that 25-35 percent of your daily energy (food) intake should come from fats.  One might ask how can this be achieved without having to dissect each meal on a daily basis?  Switch to skim or 1% milk instead of whole milk.  Focus on lean meats such as ground Kathy Blankenship, fresh fish, baked chicken, and lean cuts of beef as your source of dietary protein.  Limit saturated fat consumption to less than 10% of your daily caloric intake.  Limit trans fatty acid consumption primarily by limiting synthetic trans fats such as partially hydrogenated oils (Ex: fried fast foods).  Substitute olive or vegetable oil for solid fats where possible.  Moderation of Salt Intake: Provided you don't carry a diagnosis of congestive heart failure nor renal failure, I recommend a daily allowance of no more than 2300 mg of salt (sodium).  Keeping under this daily goal is associated with a decreased risk of cardiovascular events, creeping  above it can lead to elevated blood pressures and increases your risk of cardiovascular events.  Milligrams (mg) of salt is listed on all nutrition labels, and your daily intake can add up faster than you think.  Most canned and frozen dinners can pack in over half your daily salt allowance in one meal.    Lifestyle Health Risks: Certain lifestyle choices carry specific health risks.  As you may already know, tobacco use has been associated with increasing one's risk of cardiovascular disease, pulmonary disease, numerous cancers, among many other issues.  What you may not know is that there are medications and nicotine replacement strategies that can more than double your chances of successfully quitting.  I would be thrilled to help manage your quitting strategy if you currently use tobacco products.  When it comes to alcohol use, I've yet to find an "ideal" daily allowance.  Provided an individual does not have a medical condition that is exacerbated by alcohol consumption, general guidelines determine "safe drinking" as no more than two standard drinks for a man or no more than one standard drink for a female per day.  However, much debate still exists on whether any amount of alcohol consumption is technically "safe".  My general advice, keep alcohol consumption to a minimum for general health promotion.  If you or others believe that alcohol, tobacco, or recreational drug use is interfering with your life, I would be happy to provide confidential counseling regarding treatment options.  General "Over The Counter" Nutrition Advice: Postmenopausal women should aim for a daily calcium intake of 1200 mg, however a significant portion of this might already be  provided by diets including milk, yogurt, cheese, and other dairy products.  Vitamin D has been shown to help preserve bone density, prevent fatigue, and has even been shown to help reduce falls in the elderly.  Ensuring a daily intake of 800 Units of  Vitamin D is a good place to start to enjoy the above benefits, we can easily check your Vitamin D level to see if you'd potentially benefit from supplementation beyond 800 Units a day.  Folic Acid intake should be of particular concern to women of childbearing age.  Daily consumption of 401-027 mcg of Folic Acid is recommended to minimize the chance of spinal cord defects in a fetus should pregnancy occur.    For many adults, accidents still remain one of the most common culprits when it comes to cause of death.  Some of the simplest but most effective preventitive habits you can adopt include regular seatbelt use, proper helmet use, securing firearms, and regularly testing your smoke and carbon monoxide detectors.  Kathy Blankenship Kathy Blankenship, Kathy Blankenship, Kathy Blankenship [id number] Phone: [phone]

## 2015-08-10 ENCOUNTER — Telehealth: Payer: Self-pay | Admitting: Family Medicine

## 2015-08-10 LAB — COMPLETE METABOLIC PANEL WITH GFR
ALBUMIN: 4.3 g/dL (ref 3.6–5.1)
ALK PHOS: 58 U/L (ref 33–115)
ALT: 13 U/L (ref 6–29)
AST: 14 U/L (ref 10–30)
BUN: 8 mg/dL (ref 7–25)
CALCIUM: 9.4 mg/dL (ref 8.6–10.2)
CO2: 27 mmol/L (ref 20–31)
Chloride: 107 mmol/L (ref 98–110)
Creat: 0.67 mg/dL (ref 0.50–1.10)
GFR, Est Non African American: >89 mL/min (ref >=60)
Glucose, Bld: 75 mg/dL (ref 65–99)
POTASSIUM: 3.6 mmol/L (ref 3.5–5.3)
Sodium: 140 mmol/L (ref 135–146)
Total Bilirubin: 0.7 mg/dL (ref 0.2–1.2)
Total Protein: 7.3 g/dL (ref 6.1–8.1)

## 2015-08-10 LAB — CBC
HCT: 41.1 % (ref 36.0–46.0)
Hemoglobin: 13.6 g/dL (ref 12.0–15.0)
MCH: 28.2 pg (ref 26.0–34.0)
MCHC: 33.1 g/dL (ref 30.0–36.0)
MCV: 85.1 fL (ref 78.0–100.0)
MPV: 10.2 fL (ref 8.6–12.4)
PLATELETS: 201 10*3/uL (ref 150–400)
RBC: 4.83 MIL/uL (ref 3.87–5.11)
RDW: 13.8 % (ref 11.5–15.5)
WBC: 6 10*3/uL (ref 4.0–10.5)

## 2015-08-10 LAB — LIPID PANEL
CHOLESTEROL: 154 mg/dL (ref 125–200)
HDL: 56 mg/dL (ref >=46)
LDL CALC: 80 mg/dL (ref <130)
Total CHOL/HDL Ratio: 2.8 Ratio (ref <=5.0)
Triglycerides: 90 mg/dL (ref <150)
VLDL: 18 mg/dL (ref <30)

## 2015-08-10 LAB — TESTOSTERONE: Testosterone: 56 ng/dL

## 2015-08-10 LAB — VITAMIN D 25 HYDROXY (VIT D DEFICIENCY, FRACTURES): VIT D 25 HYDROXY: 18 ng/mL — AB (ref 30–100)

## 2015-08-10 LAB — TSH: TSH: 2.65 m[IU]/L

## 2015-08-10 MED ORDER — VITAMIN D (ERGOCALCIFEROL) 1.25 MG (50000 UNIT) PO CAPS: 50000.0000 [IU] | ORAL_CAPSULE | ORAL | Status: DC

## 2015-08-10 NOTE — Telephone Encounter (Signed)
Will you please let patient know that her Vitamin D level remains in the deficient range and I'd recommend she start a weekly supplement for the next three months. I"ll send this to her [facility] in [place].  Also I couldn't find any reason for her irregular periods.  If she's interested I'd be happy to offer her a hormone pill regimen in hopes of getting them regular over the next few months.  Her kidney function, liver function, blood sugar, cholesterol, and blood cell counts were all normal.

## 2015-08-10 NOTE — Telephone Encounter (Signed)
Pt.notified

## 2015-08-15 ENCOUNTER — Encounter: Payer: PRIVATE HEALTH INSURANCE | Admitting: Family Medicine

## 2015-08-16 ENCOUNTER — Ambulatory Visit (INDEPENDENT_AMBULATORY_CARE_PROVIDER_SITE_OTHER): Payer: BLUE CROSS/BLUE SHIELD

## 2015-08-16 DIAGNOSIS — Z1231 Encounter for screening mammogram for malignant neoplasm of breast: Secondary | ICD-10-CM | POA: Diagnosis not present

## 2016-03-26 ENCOUNTER — Ambulatory Visit (INDEPENDENT_AMBULATORY_CARE_PROVIDER_SITE_OTHER): Payer: BLUE CROSS/BLUE SHIELD | Admitting: Family Medicine

## 2016-03-26 VITALS — BP 113/69 | HR 79 | Wt 148.0 lb

## 2016-03-26 DIAGNOSIS — N926 Irregular menstruation, unspecified: Secondary | ICD-10-CM | POA: Insufficient documentation

## 2016-03-26 DIAGNOSIS — N946 Dysmenorrhea, unspecified: Secondary | ICD-10-CM

## 2016-03-26 HISTORY — DX: Irregular menstruation, unspecified: N92.6

## 2016-03-26 LAB — CBC
HCT: 35.6 % (ref 35.0–45.0)
HEMOGLOBIN: 12.1 g/dL (ref 11.7–15.5)
MCH: 28.7 pg (ref 27.0–33.0)
MCHC: 34 g/dL (ref 32.0–36.0)
MCV: 84.4 fL (ref 80.0–100.0)
MPV: 9.8 fL (ref 7.5–12.5)
PLATELETS: 208 10*3/uL (ref 140–400)
RBC: 4.22 MIL/uL (ref 3.80–5.10)
RDW: 13.6 % (ref 11.0–15.0)
WBC: 5.7 10*3/uL (ref 3.8–10.8)

## 2016-03-26 LAB — TSH: TSH: 2.14 mIU/L

## 2016-03-26 LAB — COMPLETE METABOLIC PANEL WITH GFR
ALBUMIN: 4.3 g/dL (ref 3.6–5.1)
ALK PHOS: 40 U/L (ref 33–115)
ALT: 8 U/L (ref 6–29)
AST: 12 U/L (ref 10–30)
BUN: 10 mg/dL (ref 7–25)
CHLORIDE: 107 mmol/L (ref 98–110)
CO2: 26 mmol/L (ref 20–31)
Calcium: 9.2 mg/dL (ref 8.6–10.2)
Creat: 0.74 mg/dL (ref 0.50–1.10)
GFR, Est African American: >89 mL/min (ref >=60)
GLUCOSE: 80 mg/dL (ref 65–99)
POTASSIUM: 4.1 mmol/L (ref 3.5–5.3)
SODIUM: 142 mmol/L (ref 135–146)
Total Bilirubin: 0.7 mg/dL (ref 0.2–1.2)
Total Protein: 7 g/dL (ref 6.1–8.1)

## 2016-03-26 NOTE — Patient Instructions (Signed)
Thank you for coming in today. Get blood work today.  Get ultrasound soon.  Return with Dr Sheppard Coil on the 15th.  Return sooner if needed.  Take up to 2 aleve twice daily for pain as needed.  If your belly pain worsens, or you have high fever, bad vomiting, blood in your stool or black tarry stool go to the Emergency Room.

## 2016-03-26 NOTE — Progress Notes (Signed)
Kathy Blankenship is a 45 y.o. female who presents to [facility] today for abdominal pain. Patient has a history of irregular periods. She had a menstrual period 4 days ago however before that it had been 9 months since her last menstrual period. With the onset of menstruation she developed significant abdominal cramping and pain. This has waxed and waned and was very bad yesterday. It's a lot better today. She denies any vomiting or diarrhea fevers or chills. She notes that the bleeding is not particularly heavy or excessive. She does not take any medications to regulate her menstrual periods. Denies any urinary frequency urgency or dysuria and notes her symptoms are not consistent with previous urinary tract infections. She has an appointment with her primary care provider on November 15.  She notes that approximately 4-5 years ago she had a pelvic ultrasound that she reports was normal.   Past Medical History:  Diagnosis Date  . Fibroids   . HSV-2 (herpes simplex virus 2) infection   . Left ovarian cyst    Past Surgical History:  Procedure Laterality Date  . COLONOSCOPY  2006  . MOLE REMOVAL  1995   Social History  Substance Use Topics  . Smoking status: Never Smoker  . Smokeless tobacco: Never Used  . Alcohol use No   family history includes Heart attack in her father; Hypertension in her mother; Rheum arthritis in her father.  ROS as above:  Medications: Current Outpatient Prescriptions  Medication Sig Dispense Refill  . Beclomethasone Dipropionate (QNASL) 80 MCG/ACT AERS Place 2 sprays into both nostrils daily. 1 Inhaler 2  . Vitamin D, Ergocalciferol, (DRISDOL) 50000 units CAPS capsule Take 1 capsule (50,000 Units total) by mouth every 7 (seven) days. Recheck Vitamin D in 3 Months 12 capsule 0   No current facility-administered medications for this visit.     No Known Allergies  Health Maintenance Health Maintenance  Topic Date Due  . HIV Screening  02/05/1988  . INFLUENZA VACCINE  08/08/2016 (Originally 12/26/2015)  . PAP SMEAR  05/31/2017  . TETANUS/TDAP  12/21/2023     Exam:  BP 113/69   Pulse 79   Wt 148 lb (67.1 kg)   BMI 24.63 kg/m  Gen: Well NAD HEENT: EOMI,  MMM Lungs: Normal work of breathing. CTABL Heart: RRR no MRG Abd: NABS, Soft. Nondistended, Nontender No rebound or guarding Exts: Brisk capillary refill, warm and well perfused.    No results found for this or any previous visit (from the past 72 hour(s)). No results found.    Assessment and Plan: 45 y.o. female with  Irregular and painful menstruation. With unclear etiology. Plan to obtain workup listed below and follow-up with PCP. Differential includes fibroids, PCOS, perimenopause, endometrial cancer.  Return sooner if needed.   Orders Placed This Encounter  Procedures  . US Transvaginal Non-OB    Standing Status:   Future    Standing Expiration Date:   05/26/2017    Order Specific Question:   Reason for Exam (SYMPTOM  OR DIAGNOSIS REQUIRED)    Answer:   eval painful mensturation. LMP 4 days ago but 9 months before that    Order Specific Question:   Preferred imaging location?    Answer:   [facility]  . US Pelvis Complete    Standing Status:   Future    Standing Expiration Date:   05/26/2017    Order Specific Question:   Reason for  Exam (SYMPTOM  OR DIAGNOSIS REQUIRED)    Answer:   eval painful mensturation. LMP 4 days ago but 9 months before that    Order Specific Question:   Preferred imaging location?    Answer:   [facility]  . CBC  . COMPLETE METABOLIC PANEL WITH GFR  . TSH  . Follicle stimulating hormone  . LH  . Estrogens, total  . Progesterone  . B-HCG Quant    Discussed warning signs or symptoms. Please see discharge instructions. Patient expresses understanding.

## 2016-03-27 LAB — HCG, QUANTITATIVE, PREGNANCY: hCG, Beta Chain, Quant, S: <2 m[IU]/mL

## 2016-03-27 LAB — FOLLICLE STIMULATING HORMONE: FSH: 78.1 m[IU]/mL

## 2016-03-27 LAB — LUTEINIZING HORMONE: LH: 21 m[IU]/mL

## 2016-03-27 LAB — PROGESTERONE: Progesterone: <0.5 ng/mL

## 2016-03-28 ENCOUNTER — Ambulatory Visit (INDEPENDENT_AMBULATORY_CARE_PROVIDER_SITE_OTHER): Payer: BLUE CROSS/BLUE SHIELD

## 2016-03-28 DIAGNOSIS — N946 Dysmenorrhea, unspecified: Secondary | ICD-10-CM

## 2016-03-28 DIAGNOSIS — D259 Leiomyoma of uterus, unspecified: Secondary | ICD-10-CM

## 2016-03-28 DIAGNOSIS — N926 Irregular menstruation, unspecified: Secondary | ICD-10-CM

## 2016-03-29 LAB — ESTROGENS, TOTAL: ESTROGEN: 116.7 pg/mL

## 2016-04-10 ENCOUNTER — Ambulatory Visit: Payer: BLUE CROSS/BLUE SHIELD | Admitting: Osteopathic Medicine

## 2016-04-23 ENCOUNTER — Encounter: Payer: Self-pay | Admitting: Osteopathic Medicine

## 2016-04-23 ENCOUNTER — Ambulatory Visit (INDEPENDENT_AMBULATORY_CARE_PROVIDER_SITE_OTHER): Payer: BLUE CROSS/BLUE SHIELD | Admitting: Osteopathic Medicine

## 2016-04-23 VITALS — BP 120/66 | HR 70 | Ht 65.0 in | Wt 148.0 lb

## 2016-04-23 DIAGNOSIS — N946 Dysmenorrhea, unspecified: Secondary | ICD-10-CM | POA: Diagnosis not present

## 2016-04-23 DIAGNOSIS — N926 Irregular menstruation, unspecified: Secondary | ICD-10-CM

## 2016-04-23 DIAGNOSIS — D259 Leiomyoma of uterus, unspecified: Secondary | ICD-10-CM | POA: Diagnosis not present

## 2016-04-23 DIAGNOSIS — Z23 Encounter for immunization: Secondary | ICD-10-CM

## 2016-04-23 DIAGNOSIS — B351 Tinea unguium: Secondary | ICD-10-CM

## 2016-04-23 MED ORDER — CICLOPIROX 8 % EX SOLN: Freq: Every day | CUTANEOUS | 6 refills | Status: DC

## 2016-04-23 NOTE — Progress Notes (Signed)
HPI: Kathy Blankenship is a 45 y.o. female  who presents to [facility] today, 04/23/16,  for chief complaint of:  Chief Complaint  Patient presents with  . Establish Care    SWITCHING FROM HOMMEL    Toenail fungus: Has been bothering her bit more lately. Multiple toes are involved. Nonpainful. Not sure she was to be on oral medications.  Menstrual problems: History of irregular periods, will go several months without. Then will be having an cause cramping. Briefly seen by Dr. Georgina Snell for painful menstruation, ultrasound revealed fibroids, results were reviewed with the patient.    Past medical, surgical, social and family history reviewed: Patient Active Problem List   Diagnosis Date Noted  . Irregular menstrual cycle 03/26/2016  . Vitamin D deficiency 01/26/2014  . Numbness and tingling in left upper extremity 01/14/2014  . Hyperlipidemia 12/20/2013  . TIA (transient ischemic attack) 12/20/2013  . Seasonal allergies 12/20/2013  . SINUSITIS, CHRONIC 04/27/2008  . ALLERGIC RHINITIS 04/19/2008  . Acute sinusitis 04/08/2008  . RHINITIS 03/08/2008  . DYSPNEA 03/08/2008   Past Surgical History:  Procedure Laterality Date  . COLONOSCOPY  2006  . MOLE REMOVAL  1995   Social History  Substance Use Topics  . Smoking status: Never Smoker  . Smokeless tobacco: Never Used  . Alcohol use No   Family History  Problem Relation Age of Onset  . Rheum arthritis Father   . Heart attack Father   . Hypertension Mother      Current medication list and allergy/intolerance information reviewed:   No current outpatient prescriptions on file prior to visit.   No current facility-administered medications on file prior to visit.    No Known Allergies    Review of Systems:  Constitutional: No recent illness  HEENT: No  headache, no vision change  Cardiac: No  chest pain, No  pressure, No palpitations  Respiratory:  No  shortness of breath. No   Cough  Gastrointestinal: No  abdominal pain, no change on bowel habits  Musculoskeletal: No new myalgia/arthralgia  Skin: No  Rash  Hem/Onc: No  easy bruising/bleeding, No  abnormal lumps/bumps  Neurologic: No  weakness, No  Dizziness  Psychiatric: No  concerns with depression, No  concerns with anxiety  Exam:  BP 120/66   Pulse 70   Ht 5' 5"  (1.651 m)   Wt 148 lb (67.1 kg)   BMI 24.63 kg/m   Constitutional: VS see above. General Appearance: alert, well-developed, well-nourished, NAD  Eyes: Normal lids and conjunctive, non-icteric sclera  Ears, Nose, Mouth, Throat: MMM, Normal external inspection ears/nares/mouth/lips/gums.  Neck: No masses, trachea midline.   Respiratory: Normal respiratory effort. no wheeze, no rhonchi, no rales  Cardiovascular: S1/S2 normal, no murmur, no rub/gallop auscultated. RRR.   Musculoskeletal: Gait normal. Symmetric and independent movement of all extremities  Neurological: Normal balance/coordination. No tremor.  Skin: warm, dry, intact. Positive onychomycosis  Psychiatric: Normal judgment/insight. Normal mood and affect. Oriented x3.    No results found for this or any previous visit (from the past 72 hour(s)).  No results found.   ASSESSMENT/PLAN:   Irregular menstrual cycle - Question perimenopausal, possible effect of fibroids. Hormone testing normal. OB/GYN referral if problematic  Painful menstruation - Likely due to fibroids, patient states periods are rare, refer to OB/GYN if persists  Onychomycosis - Topical therapy to start, if no better at annual physical may transition to dual therapy or oral - Plan: ciclopirox (PENLAC) 8 % solution  Uterine  leiomyoma, unspecified location  Need for prophylactic vaccination and inoculation against influenza - Plan: Flu Vaccine QUAD 36+ mos IM     Visit summary with medication list and pertinent instructions was printed for patient to review. All questions at time of visit were  answered - patient instructed to contact office with any additional concerns. ER/RTC precautions were reviewed with the patient. Follow-up plan: Return in about 4 months (around 08/08/2016) for [place].

## 2016-05-14 ENCOUNTER — Encounter (HOSPITAL_COMMUNITY): Payer: Self-pay

## 2016-05-14 ENCOUNTER — Emergency Department (HOSPITAL_COMMUNITY)
Admission: EM | Admit: 2016-05-14 | Discharge: 2016-05-14 | Disposition: A | Discharge status: Home / Self Care | Payer: BLUE CROSS/BLUE SHIELD | Attending: Emergency Medicine | Admitting: Emergency Medicine

## 2016-05-14 DIAGNOSIS — S161XXA Strain of muscle, fascia and tendon at neck level, initial encounter: Secondary | ICD-10-CM | POA: Insufficient documentation

## 2016-05-14 DIAGNOSIS — S0990XA Unspecified injury of head, initial encounter: Secondary | ICD-10-CM | POA: Diagnosis not present

## 2016-05-14 DIAGNOSIS — Y999 Unspecified external cause status: Secondary | ICD-10-CM | POA: Insufficient documentation

## 2016-05-14 DIAGNOSIS — Z79899 Other long term (current) drug therapy: Secondary | ICD-10-CM | POA: Diagnosis not present

## 2016-05-14 DIAGNOSIS — Z8673 Personal history of transient ischemic attack (TIA), and cerebral infarction without residual deficits: Secondary | ICD-10-CM | POA: Insufficient documentation

## 2016-05-14 DIAGNOSIS — S199XXA Unspecified injury of neck, initial encounter: Secondary | ICD-10-CM | POA: Diagnosis present

## 2016-05-14 DIAGNOSIS — Y92410 Unspecified street and highway as the place of occurrence of the external cause: Secondary | ICD-10-CM | POA: Insufficient documentation

## 2016-05-14 DIAGNOSIS — Y939 Activity, unspecified: Secondary | ICD-10-CM | POA: Diagnosis not present

## 2016-05-14 DIAGNOSIS — T148XXA Other injury of unspecified body region, initial encounter: Secondary | ICD-10-CM

## 2016-05-14 MED ORDER — LIDOCAINE 5 % EX PTCH: 1.0000 | MEDICATED_PATCH | CUTANEOUS | 0 refills | Status: DC

## 2016-05-14 MED ORDER — NAPROXEN 500 MG PO TABS: 500.0000 mg | ORAL_TABLET | Freq: Two times a day (BID) | ORAL | 0 refills | Status: DC

## 2016-05-14 MED ORDER — METHOCARBAMOL 500 MG PO TABS: 500.0000 mg | ORAL_TABLET | Freq: Two times a day (BID) | ORAL | 0 refills | Status: DC
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
## 2016-05-14 NOTE — Discharge Instructions (Signed)
Expect your soreness to increase over the next 2-3 days. Take it easy, but do not lay around too much as this may make any stiffness worse. Take 500 mg of naproxen every 12 hours or 800 mg of ibuprofen every 8 hours for the next 3 days. Take these medications with food to avoid upset stomach. Robaxin is a muscle relaxer and may help loosen stiff muscles. Do not take the Robaxin while driving or performing other dangerous activities. Be sure to perform the attached exercises starting with three times a week and working up to performing them daily. This is an essential part of preventing long term problems. Follow up with a primary care provider for any future management of these complaints.  You may have also sustained a head injury. Refer to the attached literature for symptoms that are to be expected and those that would require you to return to ED.

## 2016-05-30 ENCOUNTER — Ambulatory Visit (INDEPENDENT_AMBULATORY_CARE_PROVIDER_SITE_OTHER): Payer: BLUE CROSS/BLUE SHIELD | Admitting: Osteopathic Medicine

## 2016-05-30 ENCOUNTER — Encounter: Payer: Self-pay | Admitting: Osteopathic Medicine

## 2016-05-30 VITALS — BP 117/71 | HR 92 | Ht 65.0 in | Wt 148.0 lb

## 2016-05-30 DIAGNOSIS — M25562 Pain in left knee: Secondary | ICD-10-CM | POA: Diagnosis not present

## 2016-05-30 DIAGNOSIS — S161XXA Strain of muscle, fascia and tendon at neck level, initial encounter: Secondary | ICD-10-CM
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
## 2016-05-30 NOTE — Patient Instructions (Signed)
If knee pain persists, please make appt w/ Dr Georgina Snell or Dr T (sports medicine)

## 2016-08-26 ENCOUNTER — Ambulatory Visit (INDEPENDENT_AMBULATORY_CARE_PROVIDER_SITE_OTHER): Payer: BLUE CROSS/BLUE SHIELD | Admitting: Osteopathic Medicine

## 2016-08-26 ENCOUNTER — Encounter: Payer: Self-pay | Admitting: Osteopathic Medicine

## 2016-08-26 VITALS — BP 112/74 | HR 86 | Ht 65.0 in | Wt 146.0 lb

## 2016-08-26 DIAGNOSIS — Z0189 Encounter for other specified special examinations: Secondary | ICD-10-CM | POA: Diagnosis not present

## 2016-08-26 DIAGNOSIS — Z0000 Encounter for general adult medical examination without abnormal findings: Secondary | ICD-10-CM | POA: Diagnosis not present

## 2016-08-26 DIAGNOSIS — R252 Cramp and spasm: Secondary | ICD-10-CM | POA: Insufficient documentation

## 2016-08-26 DIAGNOSIS — N926 Irregular menstruation, unspecified: Secondary | ICD-10-CM

## 2016-08-26 DIAGNOSIS — E785 Hyperlipidemia, unspecified: Secondary | ICD-10-CM

## 2016-08-26 DIAGNOSIS — B351 Tinea unguium: Secondary | ICD-10-CM | POA: Insufficient documentation

## 2016-08-26 DIAGNOSIS — R5383 Other fatigue: Secondary | ICD-10-CM | POA: Diagnosis not present

## 2016-08-26 DIAGNOSIS — E559 Vitamin D deficiency, unspecified: Secondary | ICD-10-CM | POA: Diagnosis not present

## 2016-08-26 LAB — CBC WITH DIFFERENTIAL/PLATELET
BASOS PCT: 1 %
Basophils Absolute: 52 cells/uL (ref 0–200)
EOS PCT: 2 %
Eosinophils Absolute: 104 cells/uL (ref 15–500)
HCT: 41.4 % (ref 35.0–45.0)
Hemoglobin: 14 g/dL (ref 11.7–15.5)
Lymphocytes Relative: 46 %
Lymphs Abs: 2392 cells/uL (ref 850–3900)
MCH: 28.5 pg (ref 27.0–33.0)
MCHC: 33.8 g/dL (ref 32.0–36.0)
MCV: 84.1 fL (ref 80.0–100.0)
MONOS PCT: 4 %
MPV: 9.6 fL (ref 7.5–12.5)
Monocytes Absolute: 208 cells/uL (ref 200–950)
Neutro Abs: 2444 cells/uL (ref 1500–7800)
Neutrophils Relative %: 47 %
PLATELETS: 231 10*3/uL (ref 140–400)
RBC: 4.92 MIL/uL (ref 3.80–5.10)
RDW: 14.1 % (ref 11.0–15.0)
WBC: 5.2 10*3/uL (ref 3.8–10.8)

## 2016-08-26 LAB — MAGNESIUM: MAGNESIUM: 1.9 mg/dL (ref 1.5–2.5)

## 2016-08-26 LAB — COMPLETE METABOLIC PANEL WITH GFR
ALK PHOS: 57 U/L (ref 33–115)
ALT: 20 U/L (ref 6–29)
AST: 17 U/L (ref 10–30)
Albumin: 4.4 g/dL (ref 3.6–5.1)
BUN: 11 mg/dL (ref 7–25)
CO2: 26 mmol/L (ref 20–31)
Calcium: 9.8 mg/dL (ref 8.6–10.2)
Chloride: 105 mmol/L (ref 98–110)
Creat: 0.76 mg/dL (ref 0.50–1.10)
GFR, Est Non African American: >89 mL/min (ref >=60)
Glucose, Bld: 84 mg/dL (ref 65–99)
Potassium: 4 mmol/L (ref 3.5–5.3)
SODIUM: 142 mmol/L (ref 135–146)
Total Bilirubin: 0.8 mg/dL (ref 0.2–1.2)
Total Protein: 7.5 g/dL (ref 6.1–8.1)

## 2016-08-26 LAB — LIPID PANEL
CHOL/HDL RATIO: 2.9 ratio (ref <5.0)
Cholesterol: 172 mg/dL (ref <200)
HDL: 59 mg/dL (ref >50)
LDL Cholesterol: 99 mg/dL (ref <100)
TRIGLYCERIDES: 68 mg/dL (ref <150)
VLDL: 14 mg/dL (ref <30)

## 2016-08-26 LAB — POCT URINE PREGNANCY: Preg Test, Ur: NEGATIVE

## 2016-08-26 MED ORDER — TERBINAFINE HCL 250 MG PO TABS: 250.0000 mg | ORAL_TABLET | Freq: Every day | ORAL | 0 refills | Status: DC

## 2016-08-26 NOTE — Patient Instructions (Addendum)
Plan:  Labs today to check for routine screening and for muscle cramping causes  New medication for toenail fungus - consider removal of toe nails if no improvement   Follow-up in 2 weeks or so if cramping pains persist          Please note: Preventive care issues were addressed today per annual physical requirements and should be 100% covered under your insurance, however there were other medical issues which were also addressed and insurance may bill you separately for "problem-based visit." These problems include, but are not limited to, mucsle cramping/ache, toenail issue and irregular periods.  Any questions or concerns about charges which may appear on your bill should be directed to your insurance company or to [facility] billing department, please contact our office with any other questions.

## 2016-08-26 NOTE — Progress Notes (Signed)
HPI: Kathy Blankenship is a 46 y.o. female  who presents to [facility] today, 08/26/16,  for chief complaint of:  Chief Complaint  Patient presents with  . Annual Exam    See below for review of preventive care.  Additional concerns today include:  Shooting pains in legs: bilateral, upper things, thinks may be "busted veins," stands a lot at work hard floors, stands on mat. Occasionally feels in arms. Daily over past few weeks. Last between few seconds to a minute or so at at time. Hasn't tried OTC meds.stretches in the mornings, this doesn't seem to help.   Irregular periods: Last was about 3 months ago. Not currently using any contraception, sexually active with husband, monogamous.   Toenail fungus: Has been using the lacquer without much benefit. Questions alternative interventions possible.  Requests lab work to know her blood type.  Past medical, surgical, social and family history reviewed: Patient Active Problem List   Diagnosis Date Noted  . Irregular menstrual cycle 03/26/2016  . Vitamin D deficiency 01/26/2014  . Numbness and tingling in left upper extremity 01/14/2014  . Hyperlipidemia 12/20/2013  . TIA (transient ischemic attack) 12/20/2013  . Seasonal allergies 12/20/2013  . SINUSITIS, CHRONIC 04/27/2008  . ALLERGIC RHINITIS 04/19/2008  . Acute sinusitis 04/08/2008  . RHINITIS 03/08/2008  . DYSPNEA 03/08/2008   Past Surgical History:  Procedure Laterality Date  . COLONOSCOPY  2006  . MOLE REMOVAL  1995   Social History  Substance Use Topics  . Smoking status: Never Smoker  . Smokeless tobacco: Never Used  . Alcohol use No   Family History  Problem Relation Age of Onset  . Rheum arthritis Father   . Heart attack Father   . Hypertension Mother      Current medication list and allergy/intolerance information reviewed:   No current outpatient prescriptions on file.   No current facility-administered medications for this  visit.    No Known Allergies    Review of Systems:  Constitutional:  No  fever, no chills, No recent illness, No unintentional weight changes. No significant fatigue.   HEENT: No  headache, no vision change  Cardiac: No  chest pain, No  pressure, No palpitations  Respiratory:  No  shortness of breath. No  Cough  Gastrointestinal: No  abdominal pain, No  nausea, No  vomiting,  No  blood in stool, No  diarrhea, No  constipation   Musculoskeletal: +new myalgia/arthralgia  Genitourinary: No  incontinence, No  abnormal genital bleeding, No abnormal genital discharge  Skin: No  Rash, No other wounds/concerning lesions  Hem/Onc: No  easy bruising/bleeding, No  abnormal lymph node  Endocrine: No cold intolerance,  No heat intolerance  Neurologic: No  weakness, No  dizziness, No  slurred speech/focal weakness/facial droop  Psychiatric: No  concerns with depression, No  concerns with anxiety, No sleep problems, No mood problems  Exam:  BP 112/74   Pulse 86   Ht 5' 5"  (1.651 m)   Wt 146 lb (66.2 kg)   BMI 24.30 kg/m   Constitutional: VS see above. General Appearance: alert, well-developed, well-nourished, NAD  Eyes: Normal lids and conjunctive, non-icteric sclera  Ears, Nose, Mouth, Throat: MMM, Normal external inspection ears/nares/mouth/lips/gums. TM normal bilaterally. Pharynx/tonsils no erythema, no exudate. Nasal mucosa normal.   Neck: No masses, trachea midline. No thyroid enlargement. No tenderness/mass appreciated. No lymphadenopathy  Respiratory: Normal respiratory effort. no wheeze, no rhonchi, no rales  Cardiovascular: S1/S2 normal, no murmur, no rub/gallop  auscultated. RRR. No lower extremity edema. Pedal pulse II/IV bilaterally DP and PT.   Gastrointestinal: Nontender, no masses. No hepatomegaly, no splenomegaly. No hernia appreciated. Bowel sounds normal. Rectal exam deferred.   Musculoskeletal: Gait normal. No clubbing/cyanosis of digits.   Neurological:  Normal balance/coordination. No tremor. No cranial nerve deficit on limited exam. Motor and sensation intact and symmetric. Cerebellar reflexes intact.   Skin: warm, dry, intact. No rash/ulcer. No concerning nevi or subq nodules on limited exam. Significant onychomycosis left third toe, right first and third toes  Psychiatric: Normal judgment/insight. Normal mood and affect. Oriented x3.    Results for orders placed or performed in visit on 08/26/16 (from the past 72 hour(s))  POCT urine pregnancy     Status: None   Collection Time: 08/26/16 11:20 AM  Result Value Ref Range   Preg Test, Ur Negative Negative      ASSESSMENT/PLAN:   Muscle cramp - Description more consistent with possible fibromyalgia, consider further workup for this next few weeks if no electrolyte derangement or other lab abn - Plan: CBC with Differential/Platelet, COMPLETE METABOLIC PANEL WITH GFR, TSH, Magnesium, Vitamin B12  Annual physical exam - Plan: CBC with Differential/Platelet, COMPLETE METABOLIC PANEL WITH GFR, Lipid panel, TSH, VITAMIN D 25 Hydroxy (Vit-D Deficiency, Fractures), MM DIGITAL SCREENING BILATERAL  Vitamin D deficiency - Plan: VITAMIN D 25 Hydroxy (Vit-D Deficiency, Fractures)  Hyperlipidemia, unspecified hyperlipidemia type - Plan: Lipid panel  Irregular menstrual cycle - Plan: POCT urine pregnancy  Other fatigue - Plan: CBC with Differential/Platelet, COMPLETE METABOLIC PANEL WITH GFR, TSH  Patient request for diagnostic testing - Plan: ABO AND RH   Onychomycosis - Plan: terbinafine (LAMISIL) 250 MG tablet   FEMALE PREVENTIVE CARE Updated 08/26/16   ANNUAL SCREENING/COUNSELING  Diet/Exercise - HEALTHY HABITS DISCUSSED TO DECREASE CV RISK History  Smoking Status  . Never Smoker  Smokeless Tobacco  . Never Used   History  Alcohol Use No   Depression screen PHQ 2/9 08/26/2016  Decreased Interest 0  Down, Depressed, Hopeless 0  PHQ - 2 Score 0    Domestic violence concerns -  no  HTN SCREENING - SEE [place]  Sexually active in the past year - Yes with female.  Need/want STI testing today? - no  Concerns about libido or pain with sex? - no  Plans for pregnancy? - none  INFECTIOUS DISEASE SCREENING  HIV - does not need  GC/CT - does not need  HepC - DOB [birth year] - does not need  TB - does not need  DISEASE SCREENING  Lipid - needs  DM2 - needs  Osteoporosis - women age 54+ - does not need  CANCER SCREENING  Cervical - does not need  Breast - does not need - would like mammo annually   Lung - does not need  Colon - does not need  ADULT VACCINATION  Influenza - annual vaccine recommended  Td - booster every 10 years   Zoster - option at 50, yes at 60+   PCV13 - was not indicated  PPSV23 - was not indicated Immunization History  Administered Date(s) Administered  . Influenza Whole 03/08/2008  . Influenza,inj,Quad PF,36+ Mos 04/23/2016  . Influenza-Unspecified 02/19/2014  . Tdap 12/20/2013      Patient Instructions  Plan:  Labs today to check for routine screening and for muscle cramping causes  New medication for toenail fungus - consider removal of toe nails if no improvement   Follow-up in 2 weeks or  so if cramping pains persist          Please note: Preventive care issues were addressed today per annual physical requirements and should be 100% covered under your insurance, however there were other medical issues which were also addressed and insurance may bill you separately for "problem-based visit." These problems include, but are not limited to, mucsle cramping/ache, toenail issue and irregular periods.  Any questions or concerns about charges which may appear on your bill should be directed to your insurance company or to [facility] billing department, please contact our office with any other questions.     Visit summary with medication list and pertinent instructions was printed for  patient to review. All questions at time of visit were answered - patient instructed to contact office with any additional concerns. ER/RTC precautions were reviewed with the patient. Follow-up plan: Return in about 2 weeks (around 09/09/2016) for recheck cramping pains, sooner if needed.

## 2016-08-27 LAB — VITAMIN B12: VITAMIN B 12: 884 pg/mL (ref 200–1100)

## 2016-08-27 LAB — TSH: TSH: 2.31 mIU/L

## 2016-08-27 LAB — VITAMIN D 25 HYDROXY (VIT D DEFICIENCY, FRACTURES): Vit D, 25-Hydroxy: 33 ng/mL (ref 30–100)

## 2016-08-27 LAB — ABO AND RH: Rh Type: POSITIVE

## 2016-09-25 ENCOUNTER — Encounter: Payer: BLUE CROSS/BLUE SHIELD | Admitting: Osteopathic Medicine

## 2016-09-26 ENCOUNTER — Emergency Department (INDEPENDENT_AMBULATORY_CARE_PROVIDER_SITE_OTHER)
Admission: EM | Admit: 2016-09-26 | Discharge: 2016-09-26 | Disposition: A | Discharge status: Home / Self Care | Payer: BLUE CROSS/BLUE SHIELD | Source: Home / Self Care | Attending: Family Medicine | Admitting: Family Medicine

## 2016-09-26 ENCOUNTER — Encounter: Payer: Self-pay | Admitting: *Deleted

## 2016-09-26 DIAGNOSIS — L508 Other urticaria: Secondary | ICD-10-CM

## 2016-09-26 LAB — POCT CBC W AUTO DIFF (K'VILLE URGENT CARE)

## 2016-09-26 MED ORDER — TRIAMCINOLONE ACETONIDE 40 MG/ML IJ SUSP
40.0000 mg | Freq: Once | INTRAMUSCULAR | Status: AC
  Administered 2016-09-26: 40 mg via INTRAMUSCULAR

## 2016-09-26 NOTE — ED Provider Notes (Signed)
[facility]    CSN: [id number] Arrival date & time: 09/26/16  0813     History   Chief Complaint Chief Complaint  Patient presents with  . Pruritis    HPI Kathy Blankenship is a 46 y.o. female.   Patient has been taking Lamisil for onychomycosis of her toes, and was on day 19 of her treatment when she began to feel "odd" and somewhat itchy.  She stopped Lamisil 5 days ago, but developed progressive pruritus, and yesterday noticed faint "whelps" on her arms.  No swelling of lips/mouth/tongue.  No difficulty swallowing, wheezing, or dyspnea.  No fevers, chills, and sweats, myalgias, etc and she feels well otherwise.   The history is provided by the patient.    Past Medical History:  Diagnosis Date  . Fibroids   . HSV-2 (herpes simplex virus 2) infection   . Left ovarian cyst     Patient Active Problem List   Diagnosis Date Noted  . Muscle cramp 08/26/2016  . Onychomycosis 08/26/2016  . Other fatigue 08/26/2016  . Irregular menstrual cycle 03/26/2016  . Vitamin D deficiency 01/26/2014  . Numbness and tingling in left upper extremity 01/14/2014  . Hyperlipidemia 12/20/2013  . TIA (transient ischemic attack) 12/20/2013  . Seasonal allergies 12/20/2013  . SINUSITIS, CHRONIC 04/27/2008  . ALLERGIC RHINITIS 04/19/2008  . RHINITIS 03/08/2008  . DYSPNEA 03/08/2008    Past Surgical History:  Procedure Laterality Date  . COLONOSCOPY  2006  . MOLE REMOVAL  1995    OB History    Gravida Para Term Preterm AB Living   0 0       0   SAB TAB Ectopic Multiple Live Births                   Home Medications    Prior to Admission medications   Medication Sig Start Date End Date Taking? Authorizing Provider  terbinafine (LAMISIL) 250 MG tablet Take 1 tablet (250 mg total) by mouth daily. 08/26/16 11/18/16 Yes Emeterio Reeve, DO    Family History Family History  Problem Relation Age of Onset  . Rheum arthritis Father   . Heart attack Father   . Hypertension  Mother     Social History Social History  Substance Use Topics  . Smoking status: Never Smoker  . Smokeless tobacco: Never Used  . Alcohol use No     Allergies   Patient has no known allergies.   Review of Systems Review of Systems  Constitutional: Negative for appetite change, chills and diaphoresis.  HENT: Negative for congestion, facial swelling and trouble swallowing.   Eyes: Negative.   Respiratory: Negative for chest tightness and stridor.   Cardiovascular: Negative.   Gastrointestinal: Negative.   Genitourinary: Negative for hematuria.  Hematological: Negative for adenopathy.     Physical Exam Triage Vital Signs ED Triage Vitals [09/26/16 0828]  Enc Vitals Group     BP 115/79     Pulse Rate 76     Resp      Temp 98.9 F (37.2 C)     Temp Source Oral     SpO2 100 %     Weight 151 lb (68.5 kg)     Height      Head Circumference      Peak Flow      Pain Score 0     Pain Loc      Pain Edu?      Excl. in [place]?    No  data found.   Updated Vital Signs BP 115/79 (BP Location: Left Arm)   Pulse 76   Temp 98.9 F (37.2 C) (Oral)   Wt 151 lb (68.5 kg)   LMP 01/26/2016   SpO2 100%   BMI 25.13 kg/m   Visual Acuity Right Eye Distance:   Left Eye Distance:   Bilateral Distance:    Right Eye Near:   Left Eye Near:    Bilateral Near:     Physical Exam  Constitutional: She appears well-developed and well-nourished. No distress.  HENT:  Head: Normocephalic.  Right Ear: External ear normal.  Left Ear: External ear normal.  Nose: Nose normal.  Mouth/Throat: Oropharynx is clear and moist.  Eyes: Conjunctivae and EOM are normal. Pupils are equal, round, and reactive to light.  Neck: Neck supple.  Cardiovascular: Normal heart sounds.   Pulmonary/Chest: Breath sounds normal.  Abdominal: There is no tenderness.  Musculoskeletal: She exhibits no edema.  Lymphadenopathy:    She has no cervical adenopathy.  Neurological: She is alert.  Skin: Skin is  warm and dry. Rash noted. Rash is urticarial.     Faintly erythematous, urticarial lesions on arms and abdomen.  Nursing note and vitals reviewed.    UC Treatments / Results  Labs (all labs ordered are listed, but only abnormal results are displayed) Labs Reviewed  POCT CBC W AUTO DIFF ([place]):  WBC 6.4; LY 31.5; MO 3.7; GR 64.8; Hgb 13.6; Platelets 202     EKG  EKG Interpretation None       Radiology No results found.  Procedures Procedures (including critical care time)  Medications Ordered in UC Medications  triamcinolone acetonide (KENALOG-40) injection 40 mg (not administered)     Initial Impression / Assessment and Plan / UC Course  I have reviewed the triage vital signs and the nursing notes.  Pertinent labs & imaging results that were available during my care of the patient were reviewed by me and considered in my medical decision making (see chart for details).    Suspect adverse reaction to Lamisil. Note normal CBC. Administered Kenalog 61m IM  May take antihistamine such as Benadryl, Zyrtec, Claritin, etc. Stop Lamisil.  May continue [place] nail lacquer for now. If symptoms become significantly worse during the night or over the weekend, proceed to the local emergency room.  Followup with PCP in approximately one week.    Final Clinical Impressions(s) / UC Diagnoses   Final diagnoses:  Urticaria, acute    New Prescriptions New Prescriptions   No medications on file     SKandra Nicolas MD 09/26/16 [phone]

## 2016-09-26 NOTE — ED Triage Notes (Signed)
Patient c/o progressive itching all over her body that started 3 days ago. No visible rash. She took Lamisil for 19 days and stopped it 2 days before itching started. No new meds, soaps, clothing etc.

## 2016-09-26 NOTE — Discharge Instructions (Signed)
May take antihistamine such as Benadryl, Zyrtec, Claritin, etc. Stop Lamisil.  May continue Penlac nail lacquer for now. If symptoms become significantly worse during the night or over the weekend, proceed to the local emergency room.

## 2016-10-29 ENCOUNTER — Ambulatory Visit (INDEPENDENT_AMBULATORY_CARE_PROVIDER_SITE_OTHER): Payer: BLUE CROSS/BLUE SHIELD | Admitting: Osteopathic Medicine

## 2016-10-29 ENCOUNTER — Ambulatory Visit (INDEPENDENT_AMBULATORY_CARE_PROVIDER_SITE_OTHER): Payer: BLUE CROSS/BLUE SHIELD

## 2016-10-29 ENCOUNTER — Encounter: Payer: Self-pay | Admitting: Osteopathic Medicine

## 2016-10-29 VITALS — BP 119/77 | HR 87 | Temp 98.6°F | Wt 149.0 lb

## 2016-10-29 DIAGNOSIS — S6991XA Unspecified injury of right wrist, hand and finger(s), initial encounter: Secondary | ICD-10-CM | POA: Diagnosis not present

## 2016-10-29 DIAGNOSIS — X58XXXA Exposure to other specified factors, initial encounter: Secondary | ICD-10-CM

## 2016-10-29 DIAGNOSIS — B354 Tinea corporis: Secondary | ICD-10-CM

## 2016-10-29 MED ORDER — FLUCONAZOLE 200 MG PO TABS: 200.0000 mg | ORAL_TABLET | ORAL | 0 refills | Status: DC

## 2016-10-29 NOTE — Patient Instructions (Signed)
Plan:  Trial alternative weekly medicine for the fungal infection of the skin, continue the lacquer for the nails.   Finger doesn't look broken to me! Can take over-the-counter pain meds as needed, I wouldn't worry about soaking it but if ice or warm compresses ease the discomfort then you can continue these.

## 2016-10-29 NOTE — Progress Notes (Signed)
HPI: Kathy Blankenship is a 46 y.o. female  who presents to [facility] today, 10/29/16,  for chief complaint of:  Chief Complaint  Patient presents with  . Hand Pain    RIGHT FINGER  . Sinus Problem     . Was wiping off counter, knocked wine off a wine rack, full bottle fell at its base onto her R pinky finger DIP. Swelling and some discoloration of nail bed, no erythema. Happened 6 days ago.   Skin: patchy rash, occasionally itching. Steroid shot at urgent care helped. Lamisil for onychomycosis caused some worsening itching so was stopped.     Past medical history, surgical history, social history and family history reviewed.  Patient Active Problem List   Diagnosis Date Noted  . Muscle cramp 08/26/2016  . Onychomycosis 08/26/2016  . Other fatigue 08/26/2016  . Irregular menstrual cycle 03/26/2016  . Vitamin D deficiency 01/26/2014  . Numbness and tingling in left upper extremity 01/14/2014  . Hyperlipidemia 12/20/2013  . TIA (transient ischemic attack) 12/20/2013  . Seasonal allergies 12/20/2013  . SINUSITIS, CHRONIC 04/27/2008  . ALLERGIC RHINITIS 04/19/2008  . RHINITIS 03/08/2008  . DYSPNEA 03/08/2008    Current medication list and allergy/intolerance information reviewed.   No current outpatient prescriptions on file prior to visit.   No current facility-administered medications on file prior to visit.    No Known Allergies    Review of Systems:  Constitutional: No recent illness  Cardiac: No  chest pain, No  pressure  Respiratory:  No  shortness of breath.  Musculoskeletal: +new myalgia/arthralgia  Skin: +Rash  Exam:  BP 119/77   Pulse 87   Temp 98.6 F (37 C) (Oral)   Wt 149 lb (67.6 kg)   LMP 10/08/2016   BMI 24.79 kg/m   Constitutional: VS see above. General Appearance: alert, well-developed, well-nourished, NAD.  Neck: No masses, trachea midline.   Respiratory: Normal respiratory effort.    Musculoskeletal: Gait normal. Symmetric and independent movement of all extremities. (+)hematoma under nail bed and just above DIP R 5th digit, normal ROM joint, no edema/effusion  Neurological: Normal balance/coordination. No tremor.  Skin: warm, dry, intact. Patchy hypo/hyperpigmentation c/w tinea, particularly prominent at chest and underarms  Psychiatric: Normal judgment/insight. Normal mood and affect. Oriented x3.    No results found.   ASSESSMENT/PLAN:   Injury of finger of right hand, initial encounter - Plan: DG Finger Little Right - x-ray personally reviewed, no apparent fracture. Has been a bit too long to try to drain the hematoma, continue current supportive care and come see [place] if worse.  Tinea corporis - Plan: fluconazole (DIFLUCAN) 200 MG tablet    Patient Instructions  Plan:  Trial alternative weekly medicine for the fungal infection of the skin, continue the lacquer for the nails.   Finger doesn't look broken to me! Can take over-the-counter pain meds as needed, I wouldn't worry about soaking it but if ice or warm compresses ease the discomfort then you can continue these.     Follow-up plan: Return if symptoms worsen or fail to improve.  Visit summary with medication list and pertinent instructions was printed for patient to review, alert [place] if any changes needed. All questions at time of visit were answered - patient instructed to contact office with any additional concerns. ER/RTC precautions were reviewed with the patient and understanding verbalized.

## 2017-03-03 ENCOUNTER — Ambulatory Visit (INDEPENDENT_AMBULATORY_CARE_PROVIDER_SITE_OTHER): Payer: BLUE CROSS/BLUE SHIELD | Admitting: Physician Assistant

## 2017-03-03 ENCOUNTER — Encounter: Payer: Self-pay | Admitting: Physician Assistant

## 2017-03-03 VITALS — BP 105/72 | HR 82 | Temp 98.2°F | Wt 150.0 lb

## 2017-03-03 DIAGNOSIS — L509 Urticaria, unspecified: Secondary | ICD-10-CM | POA: Diagnosis not present

## 2017-03-03 DIAGNOSIS — Z23 Encounter for immunization: Secondary | ICD-10-CM

## 2017-03-03 DIAGNOSIS — R1031 Right lower quadrant pain: Secondary | ICD-10-CM | POA: Diagnosis not present

## 2017-03-03 MED ORDER — CETIRIZINE HCL 10 MG PO TABS: 10.0000 mg | ORAL_TABLET | Freq: Every day | ORAL | 11 refills | Status: DC

## 2017-03-03 NOTE — Progress Notes (Signed)
HPI:                                                                Kathy Blankenship is a 46 y.o. female who presents to [place]: [place] today for hives and RLQ pain  Hives: patient reports history of interrmittent red, itchy rash and "welps." Patient shows a photo showing serpiginous erythematous lesions on her trunk. Rash lasts hours. Denies any known trigger. Reports she has a history of negative allergy testing. She has been taking Benadryl as needed with relief. No facial/lip/tongue swelling or shortness of breath. No hx of angioedema/anaphylaxis.  Patient reports an episode of RLQ pain this morning. Lasted minutes. Pain described as sharp. States she rolled onto her left side and pain was relieved. Denies fever, chills, nausea, vomiting, change in bowel habits. Denies hip pain.  Past Medical History:  Diagnosis Date  . Fibroids   . HSV-2 (herpes simplex virus 2) infection   . Left ovarian cyst    Past Surgical History:  Procedure Laterality Date  . COLONOSCOPY  2006  . MOLE REMOVAL  1995   Social History  Substance Use Topics  . Smoking status: Never Smoker  . Smokeless tobacco: Never Used  . Alcohol use No   family history includes Heart attack in her father; Hypertension in her mother; Rheum arthritis in her father.  ROS: negative except as noted in the HPI  Medications: No current outpatient prescriptions on file.   No current facility-administered medications for this visit.    No Known Allergies     Objective:  BP 105/72   Pulse 82   Temp 98.2 F (36.8 C) (Oral)   Wt 150 lb (68 kg)   BMI 24.96 kg/m  Gen:  alert, not ill-appearing, no distress, appropriate for age, overweight female HEENT: head normocephalic without obvious abnormality, conjunctiva and cornea clear, trachea midline Pulm: Normal work of breathing, normal phonation, clear to auscultation bilaterally, no wheezes, rales or rhonchi CV: Normal rate,  regular rhythm, s1 and s2 distinct, no murmurs, clicks or rubs  GI: abdomen normal appearing, soft, nondistended, nontender, negative McBurney's point, no palpable masses, no organomegaly Neuro: alert and oriented x 3, no tremor MSK: extremities atraumatic, normal gait and station; hip abduction/adduction strength 5/5 and symmetric Skin: intact, no rashes on exposed skin, no jaundice, no cyanosis Psych: well-groomed, cooperative, good eye contact, euthymic mood, affect mood-congruent, speech is articulate, and thought processes clear and goal-directed    No results found for this or any previous visit (from the past 72 hour(s)). No results found.    Assessment and Plan: 46 y.o. female with   1. Urticaria - symptoms consistent chronic spontaneous urticaria - reassurance given - recommend daily Zyrtec with option to increase to twice a day or switch to Xyzal bid if Zyrtec fails - cetirizine (ZYRTEC) 10 MG tablet; Take 1 tablet (10 mg total) by mouth daily.  Dispense: 30 tablet; Refill: 11  2. RLQ abdominal pain - suspect muscle cramp - reassurance given - follow-up if symptoms worsen  3. Need for immunization against influenza - Flu Vaccine QUAD 36+ mos IM  Patient education and anticipatory guidance given Patient agrees with treatment plan Follow-up as needed if symptoms worsen or fail to improve  I spent 25 minutes with this patient, greater than 50% was face-to-face time counseling regarding the above diagnoses   Darlyne Russian PA-C

## 2017-08-08 ENCOUNTER — Ambulatory Visit (INDEPENDENT_AMBULATORY_CARE_PROVIDER_SITE_OTHER): Payer: BLUE CROSS/BLUE SHIELD | Admitting: Osteopathic Medicine

## 2017-08-08 ENCOUNTER — Encounter: Payer: Self-pay | Admitting: Osteopathic Medicine

## 2017-08-08 VITALS — BP 122/82 | HR 72 | Temp 98.1°F | Wt 139.1 lb

## 2017-08-08 DIAGNOSIS — B354 Tinea corporis: Secondary | ICD-10-CM

## 2017-08-08 MED ORDER — KETOCONAZOLE 2 % EX CREA: 1.0000 "application " | TOPICAL_CREAM | Freq: Two times a day (BID) | CUTANEOUS | 1 refills | Status: DC

## 2017-08-08 NOTE — Patient Instructions (Signed)
I think what we are looking at is a fungal infection of the skin that is a little bit worse in the one spot that's bothering you. Let's try treating with topical cream for at least 2 weeks, it should be getting a little bit better after that point if not totally gone.   If getting better, would continue treatment with cream for another 2 weeks or so. We have the option to switch to oral medications if cream isn't helping.   If no better with the oral medications, would probably get a biopsy to confirm the diagnosis.  Call/message [place] in 2 weeks if rash is only improving slightly or if it isn't any better at all.

## 2017-08-08 NOTE — Progress Notes (Signed)
HPI: Kathy Blankenship is a 47 y.o. female who  has a past medical history of Fibroids, HSV-2 (herpes simplex virus 2) infection, and Left ovarian cyst.  she presents to [facility] today, 08/08/17,  for chief complaint of:  Skin lesion  Present since November without change, on R upper buttock/hip, nonpainful, some itching. Would just like it checked.    Past medical history, surgical history, social history and family history reviewed. No updates needed.   Current medication list and allergy/intolerance information reviewed.    Current Outpatient Medications on File Prior to Visit  Medication Sig Dispense Refill  . cetirizine (ZYRTEC) 10 MG tablet Take 1 tablet (10 mg total) by mouth daily. 30 tablet 11   No current facility-administered medications on file prior to visit.    Allergies  Allergen Reactions  . Lamisil [Terbinafine] Rash      Review of Systems:  Constitutional: No recent illness  Musculoskeletal: No new myalgia/arthralgia  Skin: +Rash  Hem/Onc: No  abnormal lumps/bumps   Exam:  BP 122/82   Pulse 72   Temp 98.1 F (36.7 C) (Oral)   Wt 139 lb 1.9 oz (63.1 kg)   BMI 23.15 kg/m   Constitutional: VS see above. General Appearance: alert, well-developed, well-nourished, NAD  Respiratory: Normal respiratory effort  Musculoskeletal: Gait normal.   Neurological: Normal balance/coordination.  Skin: warm, dry, intact. Patchy hypopigmentation lower back, one area right posterior hip a bit scaly, fairly well delineated, consistent with tinea  Psychiatric: Normal judgment/insight. Normal mood and affect. Oriented x3.     ASSESSMENT/PLAN:   Tinea corporis   Meds ordered this encounter  Medications  . ketoconazole (NIZORAL) 2 % cream    Sig: Apply 1 application topically 2 (two) times daily. To affected areas. For 2-4 weeks    Dispense:  60 g    Refill:  1    Patient Instructions  I think what we are looking at is  a fungal infection of the skin that is a little bit worse in the one spot that's bothering you. Let's try treating with topical cream for at least 2 weeks, it should be getting a little bit better after that point if not totally gone.   If getting better, would continue treatment with cream for another 2 weeks or so. We have the option to switch to oral medications if cream isn't helping.   If no better with the oral medications, would probably get a biopsy to confirm the diagnosis.  Call/message [place] in 2 weeks if rash is only improving slightly or if it isn't any better at all.     Follow-up plan: Return for recheck rash as needed - see instructions above .  Visit summary with medication list and pertinent instructions was printed for patient to review, alert [place] if any changes needed. All questions at time of visit were answered - patient instructed to contact office with any additional concerns. ER/RTC precautions were reviewed with the patient and understanding verbalized.   Please note: voice recognition software was used to produce this document, and typos may escape review. Please contact Dr. Sheppard Coil for any needed clarifications.

## 2017-10-01 ENCOUNTER — Encounter: Payer: BLUE CROSS/BLUE SHIELD | Admitting: Osteopathic Medicine

## 2017-10-08 ENCOUNTER — Other Ambulatory Visit (HOSPITAL_COMMUNITY)
Admission: RE | Admit: 2017-10-08 | Discharge: 2017-10-08 | Disposition: A | Discharge status: Home / Self Care | Payer: BLUE CROSS/BLUE SHIELD | Source: Ambulatory Visit | Attending: Osteopathic Medicine | Admitting: Osteopathic Medicine

## 2017-10-08 ENCOUNTER — Ambulatory Visit (INDEPENDENT_AMBULATORY_CARE_PROVIDER_SITE_OTHER): Payer: BLUE CROSS/BLUE SHIELD | Admitting: Osteopathic Medicine

## 2017-10-08 ENCOUNTER — Encounter: Payer: Self-pay | Admitting: Osteopathic Medicine

## 2017-10-08 VITALS — BP 113/77 | HR 79 | Temp 98.1°F | Wt 138.6 lb

## 2017-10-08 DIAGNOSIS — Z1231 Encounter for screening mammogram for malignant neoplasm of breast: Secondary | ICD-10-CM

## 2017-10-08 DIAGNOSIS — Z0000 Encounter for general adult medical examination without abnormal findings: Secondary | ICD-10-CM

## 2017-10-08 DIAGNOSIS — E785 Hyperlipidemia, unspecified: Secondary | ICD-10-CM | POA: Insufficient documentation

## 2017-10-08 DIAGNOSIS — Z124 Encounter for screening for malignant neoplasm of cervix: Secondary | ICD-10-CM

## 2017-10-08 DIAGNOSIS — Z1239 Encounter for other screening for malignant neoplasm of breast: Secondary | ICD-10-CM

## 2017-10-08 MED ORDER — GABAPENTIN 300 MG PO CAPS: 300.0000 mg | ORAL_CAPSULE | Freq: Every day | ORAL | 1 refills | Status: DC

## 2017-10-08 NOTE — Patient Instructions (Signed)
Preventive Care 40-64 Years, Female Preventive care refers to lifestyle choices and visits with your health care provider that can promote health and wellness. What does preventive care include?  A yearly physical exam. This is also called an annual well check.  Dental exams once or twice a year.  Routine eye exams. Ask your health care provider how often you should have your eyes checked.  Personal lifestyle choices, including: ? Daily care of your teeth and gums. ? Regular physical activity. ? Eating a healthy diet. ? Avoiding tobacco and drug use. ? Limiting alcohol use. ? Practicing safe sex. ? Taking low-dose aspirin daily starting at age 58. ? Taking vitamin and mineral supplements as recommended by your health care provider. What happens during an annual well check? The services and screenings done by your health care provider during your annual well check will depend on your age, overall health, lifestyle risk factors, and family history of disease. Counseling Your health care provider may ask you questions about your:  Alcohol use.  Tobacco use.  Drug use.  Emotional well-being.  Home and relationship well-being.  Sexual activity.  Eating habits.  Work and work Statistician.  Method of birth control.  Menstrual cycle.  Pregnancy history.  Screening You may have the following tests or measurements:  Height, weight, and BMI.  Blood pressure.  Lipid and cholesterol levels. These may be checked every 5 years, or more frequently if you are over 81 years old.  Skin check.  Lung cancer screening. You may have this screening every year starting at age 78 if you have a 30-pack-year history of smoking and currently smoke or have quit within the past 15 years.  Fecal occult blood test (FOBT) of the stool. You may have this test every year starting at age 65.  Flexible sigmoidoscopy or colonoscopy. You may have a sigmoidoscopy every 5 years or a colonoscopy  every 10 years starting at age 30.  Hepatitis C blood test.  Hepatitis B blood test.  Sexually transmitted disease (STD) testing.  Diabetes screening. This is done by checking your blood sugar (glucose) after you have not eaten for a while (fasting). You may have this done every 1-3 years.  Mammogram. This may be done every 1-2 years. Talk to your health care provider about when you should start having regular mammograms. This may depend on whether you have a family history of breast cancer.  BRCA-related cancer screening. This may be done if you have a family history of breast, ovarian, tubal, or peritoneal cancers.  Pelvic exam and Pap test. This may be done every 3 years starting at age 80. Starting at age 36, this may be done every 5 years if you have a Pap test in combination with an HPV test.  Bone density scan. This is done to screen for osteoporosis. You may have this scan if you are at high risk for osteoporosis.  Discuss your test results, treatment options, and if necessary, the need for more tests with your health care provider. Vaccines Your health care provider may recommend certain vaccines, such as:  Influenza vaccine. This is recommended every year.  Tetanus, diphtheria, and acellular pertussis (Tdap, Td) vaccine. You may need a Td booster every 10 years.  Varicella vaccine. You may need this if you have not been vaccinated.  Zoster vaccine. You may need this after age 5.  Measles, mumps, and rubella (MMR) vaccine. You may need at least one dose of MMR if you were born in  1957 or later. You may also need a second dose.  Pneumococcal 13-valent conjugate (PCV13) vaccine. You may need this if you have certain conditions and were not previously vaccinated.  Pneumococcal polysaccharide (PPSV23) vaccine. You may need one or two doses if you smoke cigarettes or if you have certain conditions.  Meningococcal vaccine. You may need this if you have certain  conditions.  Hepatitis A vaccine. You may need this if you have certain conditions or if you travel or work in places where you may be exposed to hepatitis A.  Hepatitis B vaccine. You may need this if you have certain conditions or if you travel or work in places where you may be exposed to hepatitis B.  Haemophilus influenzae type b (Hib) vaccine. You may need this if you have certain conditions.  Talk to your health care provider about which screenings and vaccines you need and how often you need them. This information is not intended to replace advice given to you by your health care provider. Make sure you discuss any questions you have with your health care provider. Document Released: 06/09/2015 Document Revised: 01/31/2016 Document Reviewed: 03/14/2015 Elsevier Interactive Patient Education  2018 [employer]  

## 2017-10-08 NOTE — Progress Notes (Signed)
HPI: Kathy Blankenship is a 47 y.o. female who  has a past medical history of Fibroids, HSV-2 (herpes simplex virus 2) infection, and Left ovarian cyst.  she presents to [facility] today, 10/08/17,  for chief complaint of: Annual Physical  Patient here for annual physical / wellness exam.  See preventive care reviewed as below.  Recent labs reviewed   Additional concerns today include:  None    Past medical, surgical, social and family history reviewed:  Patient Active Problem List   Diagnosis Date Noted  . Urticaria 03/03/2017  . Muscle cramp 08/26/2016  . Onychomycosis 08/26/2016  . Other fatigue 08/26/2016  . Irregular menstrual cycle 03/26/2016  . Vitamin D deficiency 01/26/2014  . Numbness and tingling in left upper extremity 01/14/2014  . Hyperlipidemia 12/20/2013  . TIA (transient ischemic attack) 12/20/2013  . Seasonal allergies 12/20/2013  . SINUSITIS, CHRONIC 04/27/2008  . ALLERGIC RHINITIS 04/19/2008  . RHINITIS 03/08/2008  . DYSPNEA 03/08/2008    Past Surgical History:  Procedure Laterality Date  . COLONOSCOPY  2006  . MOLE REMOVAL  1995    Social History   Tobacco Use  . Smoking status: Never Smoker  . Smokeless tobacco: Never Used  Substance Use Topics  . Alcohol use: No    Family History  Problem Relation Age of Onset  . Rheum arthritis Father   . Heart attack Father   . Hypertension Mother      Current medication list and allergy/intolerance information reviewed:    Current Outpatient Medications  Medication Sig Dispense Refill  . cetirizine (ZYRTEC) 10 MG tablet Take 1 tablet (10 mg total) by mouth daily. (Patient not taking: Reported on 08/08/2017) 30 tablet 11  . ketoconazole (NIZORAL) 2 % cream Apply 1 application topically 2 (two) times daily. To affected areas. For 2-4 weeks 60 g 1   No current facility-administered medications for this visit.     Allergies  Allergen Reactions  . Lamisil  [Terbinafine] Rash      Review of Systems:  Constitutional:  No  fever, no chills, No recent illness,  HEENT: No  headache, no vision change, no hearing change  Cardiac: No  chest pain, No  pressure, No palpitations, No  Orthopnea  Respiratory:  No  shortness of breath. No  Cough  Gastrointestinal: No  abdominal pain, No  nausea, No  vomiting,  No  blood in stool, No  diarrhea, No  constipation   Musculoskeletal: No new myalgia/arthralgia  Skin: No  Rash  Genitourinary: No  incontinence, No  abnormal genital bleeding, No abnormal genital discharge  Hem/Onc: No  easy bruising/bleeding, No  abnormal lymph node  Endocrine: No cold intolerance,  No heat intolerance  Neurologic: No  weakness, No  dizziness, No  slurred speech/focal weakness/facial droop  Psychiatric: No  concerns with depression, No  concerns with anxiety, No sleep problems, No mood problems  Exam:  BP 113/77 (BP Location: Left Arm, Patient Position: Sitting, Cuff Size: Normal)   Pulse 79   Temp 98.1 F (36.7 C) (Oral)   Wt 138 lb 9.6 oz (62.9 kg)   BMI 23.06 kg/m   Constitutional: VS see above. General Appearance: alert, well-developed, well-nourished, NAD  Eyes: Normal lids and conjunctive, non-icteric sclera  Ears, Nose, Mouth, Throat: MMM, Normal external inspection ears/nares/mouth/lips/gums. TM normal bilaterally. Pharynx/tonsils no erythema, no exudate. Nasal mucosa normal.   Neck: No masses, trachea midline. No thyroid enlargement. No tenderness/mass appreciated. No lymphadenopathy  Respiratory: Normal respiratory effort.  no wheeze, no rhonchi, no rales  Cardiovascular: S1/S2 normal, no murmur, no rub/gallop auscultated. RRR. No lower extremity edema.   Gastrointestinal: Nontender, no masses. No hepatomegaly, no splenomegaly. No hernia appreciated. Bowel sounds normal. Rectal exam deferred.   Musculoskeletal: Gait normal. No clubbing/cyanosis of digits.   Neurological: Normal  balance/coordination. No tremor. No cranial nerve deficit on limited exam. Motor and sensation intact and symmetric. Cerebellar reflexes intact.   Skin: warm, dry, intact. No rash/ulcer. No concerning nevi or subq nodules on limited exam.    Psychiatric: Normal judgment/insight. Normal mood and affect. Oriented x3. GYN: No lesions/ulcers to external genitalia, normal urethra, normal vaginal mucosa, physiologic discharge, cervix normal without lesions, uterus not enlarged or tender, adnexa no masses and nontender  BREAST: No rashes/skin changes, normal fibrous breast tissue, no masses or tenderness, normal nipple without discharge, normal axilla      ASSESSMENT/PLAN:   Annual physical exam - Plan: CBC, COMPLETE METABOLIC PANEL WITH GFR, Lipid panel  Hyperlipidemia, unspecified hyperlipidemia type - Plan: Lipid panel  Cervical cancer screening - Plan: Cytology - PAP  Breast cancer screening - Plan: MM 3D SCREEN BREAST BILATERAL    FEMALE PREVENTIVE CARE Updated 10/08/17   ANNUAL SCREENING/COUNSELING  Diet/Exercise - HEALTHY HABITS DISCUSSED TO DECREASE CV RISK Social History   Tobacco Use  Smoking Status Never Smoker  Smokeless Tobacco Never Used   Social History   Substance and Sexual Activity  Alcohol Use No   Depression screen PHQ 2/9 08/26/2016  Decreased Interest 0  Down, Depressed, Hopeless 0  PHQ - 2 Score 0    Domestic violence concerns - no  HTN SCREENING - SEE [place]  Sexually active in the past year - Yes with female.  Need/want STI testing today? - no  Concerns about libido or pain with sex? - no  Plans for pregnancy? - n/a - postmenoausal now   INFECTIOUS DISEASE SCREENING  HIV - recommended, declined  GC/CT - does not need  HepC - DOB [birth year] - does not need  TB - does not need  DISEASE SCREENING  Lipid - needs  DM2 - needs  Osteoporosis - women age 82+ - does not need  CANCER SCREENING  Cervical -  needs  Breast - needs, last mammo 2017  Lung - does not need  Colon - needs at age 41   ADULT VACCINATION  Influenza - annual vaccine recommended  Td - booster every 10 years   Zoster - Shingrix recommended 50+  PCV13 - was not indicated  PPSV23 - was not indicated Immunization History  Administered Date(s) Administered  . Influenza Whole 03/08/2008  . Influenza,inj,Quad PF,6+ Mos 04/23/2016, 03/03/2017  . Influenza,inj,quad, With Preservative 02/19/2014  . Influenza-Unspecified 02/19/2014  . Tdap 12/20/2013    OTHER  Fall - exercise and Vit D age 40+ - does not need      Patient Instructions  Preventive Care 87-64 Years, Female Preventive care refers to lifestyle choices and visits with your health care provider that can promote health and wellness. What does preventive care include?  A yearly physical exam. This is also called an annual well check.  Dental exams once or twice a year.  Routine eye exams. Ask your health care provider how often you should have your eyes checked.  Personal lifestyle choices, including: ? Daily care of your teeth and gums. ? Regular physical activity. ? Eating a healthy diet. ? Avoiding tobacco and drug use. ? Limiting alcohol use. ? Practicing safe  sex. ? Taking low-dose aspirin daily starting at age 70. ? Taking vitamin and mineral supplements as recommended by your health care provider. What happens during an annual well check? The services and screenings done by your health care provider during your annual well check will depend on your age, overall health, lifestyle risk factors, and family history of disease. Counseling Your health care provider may ask you questions about your:  Alcohol use.  Tobacco use.  Drug use.  Emotional well-being.  Home and relationship well-being.  Sexual activity.  Eating habits.  Work and work Statistician.  Method of birth control.  Menstrual cycle.  Pregnancy  history.  Screening You may have the following tests or measurements:  Height, weight, and BMI.  Blood pressure.  Lipid and cholesterol levels. These may be checked every 5 years, or more frequently if you are over 18 years old.  Skin check.  Lung cancer screening. You may have this screening every year starting at age 56 if you have a 30-pack-year history of smoking and currently smoke or have quit within the past 15 years.  Fecal occult blood test (FOBT) of the stool. You may have this test every year starting at age 45.  Flexible sigmoidoscopy or colonoscopy. You may have a sigmoidoscopy every 5 years or a colonoscopy every 10 years starting at age 45.  Hepatitis C blood test.  Hepatitis B blood test.  Sexually transmitted disease (STD) testing.  Diabetes screening. This is done by checking your blood sugar (glucose) after you have not eaten for a while (fasting). You may have this done every 1-3 years.  Mammogram. This may be done every 1-2 years. Talk to your health care provider about when you should start having regular mammograms. This may depend on whether you have a family history of breast cancer.  BRCA-related cancer screening. This may be done if you have a family history of breast, ovarian, tubal, or peritoneal cancers.  Pelvic exam and Pap test. This may be done every 3 years starting at age 61. Starting at age 68, this may be done every 5 years if you have a Pap test in combination with an HPV test.  Bone density scan. This is done to screen for osteoporosis. You may have this scan if you are at high risk for osteoporosis.  Discuss your test results, treatment options, and if necessary, the need for more tests with your health care provider. Vaccines Your health care provider may recommend certain vaccines, such as:  Influenza vaccine. This is recommended every year.  Tetanus, diphtheria, and acellular pertussis (Tdap, Td) vaccine. You may need a Td booster  every 10 years.  Varicella vaccine. You may need this if you have not been vaccinated.  Zoster vaccine. You may need this after age 59.  Measles, mumps, and rubella (MMR) vaccine. You may need at least one dose of MMR if you were born in 1957 or later. You may also need a second dose.  Pneumococcal 13-valent conjugate (PCV13) vaccine. You may need this if you have certain conditions and were not previously vaccinated.  Pneumococcal polysaccharide (PPSV23) vaccine. You may need one or two doses if you smoke cigarettes or if you have certain conditions.  Meningococcal vaccine. You may need this if you have certain conditions.  Hepatitis A vaccine. You may need this if you have certain conditions or if you travel or work in places where you may be exposed to hepatitis A.  Hepatitis B vaccine. You may need this  if you have certain conditions or if you travel or work in places where you may be exposed to hepatitis B.  Haemophilus influenzae type b (Hib) vaccine. You may need this if you have certain conditions.  Talk to your health care provider about which screenings and vaccines you need and how often you need them. This information is not intended to replace advice given to you by your health care provider. Make sure you discuss any questions you have with your health care provider. Document Released: 06/09/2015 Document Revised: 01/31/2016 Document Reviewed: 03/14/2015 Elsevier Interactive Patient Education  2018 [employer].     Visit summary with medication list and pertinent instructions was printed for patient to review. All questions at time of visit were answered - patient instructed to contact office with any additional concerns. ER/RTC precautions were reviewed with the patient.   Follow-up plan: Return in about 1 year (around 10/09/2018) for [facility], sooner as needed if any medical problems arise .   Please note: voice recognition software was used to  produce this document, and typos may escape review. Please contact Dr. Sheppard Coil for any needed clarifications.

## 2017-10-09 ENCOUNTER — Encounter: Payer: Self-pay | Admitting: Osteopathic Medicine

## 2017-10-09 LAB — COMPLETE METABOLIC PANEL WITH GFR
AG RATIO: 1.7 (calc) (ref 1.0–2.5)
ALBUMIN MSPROF: 4.3 g/dL (ref 3.6–5.1)
ALT: 12 U/L (ref 6–29)
AST: 14 U/L (ref 10–30)
Alkaline phosphatase (APISO): 60 U/L (ref 33–115)
BILIRUBIN TOTAL: 0.7 mg/dL (ref 0.2–1.2)
BUN: 11 mg/dL (ref 7–25)
CALCIUM: 9.6 mg/dL (ref 8.6–10.2)
CO2: 27 mmol/L (ref 20–32)
Chloride: 108 mmol/L (ref 98–110)
Creat: 0.66 mg/dL (ref 0.50–1.10)
GFR, EST AFRICAN AMERICAN: 125 mL/min/{1.73_m2} (ref >=60)
GFR, EST NON AFRICAN AMERICAN: 107 mL/min/{1.73_m2} (ref >=60)
GLUCOSE: 81 mg/dL (ref 65–99)
Globulin: 2.5 g/dL (calc) (ref 1.9–3.7)
Potassium: 3.7 mmol/L (ref 3.5–5.3)
Sodium: 143 mmol/L (ref 135–146)
TOTAL PROTEIN: 6.8 g/dL (ref 6.1–8.1)

## 2017-10-09 LAB — LIPID PANEL
CHOL/HDL RATIO: 2.6 (calc) (ref <5.0)
Cholesterol: 148 mg/dL (ref <200)
HDL: 56 mg/dL (ref >50)
LDL CHOLESTEROL (CALC): 79 mg/dL
Non-HDL Cholesterol (Calc): 92 mg/dL (calc) (ref <130)
TRIGLYCERIDES: 58 mg/dL (ref <150)

## 2017-10-09 LAB — CBC
HCT: 36.8 % (ref 35.0–45.0)
HEMOGLOBIN: 12.7 g/dL (ref 11.7–15.5)
MCH: 28.4 pg (ref 27.0–33.0)
MCHC: 34.5 g/dL (ref 32.0–36.0)
MCV: 82.3 fL (ref 80.0–100.0)
MPV: 11 fL (ref 7.5–12.5)
Platelets: 246 10*3/uL (ref 140–400)
RBC: 4.47 10*6/uL (ref 3.80–5.10)
RDW: 12.7 % (ref 11.0–15.0)
WBC: 7 10*3/uL (ref 3.8–10.8)

## 2017-10-13 LAB — CYTOLOGY - PAP
Diagnosis: NEGATIVE
HPV: NOT DETECTED

## 2017-12-09 ENCOUNTER — Emergency Department (INDEPENDENT_AMBULATORY_CARE_PROVIDER_SITE_OTHER)
Admission: EM | Admit: 2017-12-09 | Discharge: 2017-12-09 | Disposition: A | Discharge status: Home / Self Care | Payer: BLUE CROSS/BLUE SHIELD | Source: Home / Self Care | Attending: Family Medicine | Admitting: Family Medicine

## 2017-12-09 ENCOUNTER — Other Ambulatory Visit: Payer: Self-pay

## 2017-12-09 DIAGNOSIS — L089 Local infection of the skin and subcutaneous tissue, unspecified: Secondary | ICD-10-CM | POA: Diagnosis not present

## 2017-12-09 DIAGNOSIS — L723 Sebaceous cyst: Secondary | ICD-10-CM

## 2017-12-09 DIAGNOSIS — L03113 Cellulitis of right upper limb: Secondary | ICD-10-CM

## 2017-12-09 MED ORDER — DOXYCYCLINE HYCLATE 100 MG PO CAPS: 100.0000 mg | ORAL_CAPSULE | Freq: Two times a day (BID) | ORAL | 0 refills | Status: DC

## 2017-12-09 NOTE — ED Triage Notes (Signed)
Pt has a boil/ insect bite on the back of right arm since Thursday.  Has been using tee tree oil on it.

## 2017-12-09 NOTE — Discharge Instructions (Signed)
Leave bandage in place until follow-up visit tomorrow.   Keep bandage clean and dry.  May take Ibuprofen 274m, 4 tabs every 8 hours with food.

## 2017-12-09 NOTE — ED Provider Notes (Signed)
[name]    CSN: [id number] Arrival date & time: 12/09/17  1831     History   Chief Complaint Chief Complaint  Patient presents with  . Abscess    HPI Kathy Blankenship is a 47 y.o. female.   Patient complains of a painful raised area on her right upper arm for about one week.  The lesion has become increasingly painful during the past two days.  The history is provided by the patient.  Abscess  Abscess location: right upper arm. Size:  3cm Abscess quality: fluctuance, induration, painful, redness and warmth   Abscess quality: not draining, no itching and not weeping   Red streaking: no   Duration:  1 week Progression:  Worsening Pain details:    Quality:  Pressure and dull   Severity:  Mild   Duration:  5 days   Timing:  Constant   Progression:  Worsening Chronicity:  New Context: not insect bite/sting and not skin injury   Relieved by:  Nothing Exacerbated by: contact. Ineffective treatments: tea tree oil. Associated symptoms: no fatigue, no fever and no nausea     Past Medical History:  Diagnosis Date  . Fibroids   . HSV-2 (herpes simplex virus 2) infection   . Left ovarian cyst     Patient Active Problem List   Diagnosis Date Noted  . Urticaria 03/03/2017  . Muscle cramp 08/26/2016  . Onychomycosis 08/26/2016  . Other fatigue 08/26/2016  . Irregular menstrual cycle 03/26/2016  . Vitamin D deficiency 01/26/2014  . Numbness and tingling in left upper extremity 01/14/2014  . Hyperlipidemia 12/20/2013  . TIA (transient ischemic attack) 12/20/2013  . Seasonal allergies 12/20/2013  . SINUSITIS, CHRONIC 04/27/2008  . ALLERGIC RHINITIS 04/19/2008  . RHINITIS 03/08/2008  . DYSPNEA 03/08/2008    Past Surgical History:  Procedure Laterality Date  . COLONOSCOPY  2006  . MOLE REMOVAL  1995    OB History    Gravida  0   Para  0   Term      Preterm      AB      Living  0     SAB      TAB      Ectopic      Multiple      Live Births               Home Medications    Prior to Admission medications   Medication Sig Start Date End Date Taking? Authorizing Provider  doxycycline (VIBRAMYCIN) 100 MG capsule Take 1 capsule (100 mg total) by mouth 2 (two) times daily. Take with food. 12/09/17   Kandra Nicolas, MD  gabapentin (NEURONTIN) 300 MG capsule Take 1 capsule (300 mg total) by mouth at bedtime. 10/08/17   Emeterio Reeve, DO    Family History Family History  Problem Relation Age of Onset  . Rheum arthritis Father   . Heart attack Father   . Hypertension Mother     Social History Social History   Tobacco Use  . Smoking status: Never Smoker  . Smokeless tobacco: Never Used  Substance Use Topics  . Alcohol use: No  . Drug use: No     Allergies   Lamisil [terbinafine] and Terbinafine   Review of Systems Review of Systems  Constitutional: Negative for fatigue and fever.  Gastrointestinal: Negative for nausea.  All other systems reviewed and are negative.    Physical Exam Triage Vital Signs ED Triage Vitals  Enc Vitals  Group     BP 12/09/17 1909 120/80     Pulse Rate 12/09/17 1909 81     Resp --      Temp 12/09/17 1909 98.4 F (36.9 C)     Temp Source 12/09/17 1909 Oral     SpO2 12/09/17 1909 100 %     Weight 12/09/17 1910 138 lb (62.6 kg)     Height 12/09/17 1910 5' 5"  (1.651 m)     Head Circumference --      Peak Flow --      Pain Score 12/09/17 1909 1     Pain Loc --      Pain Edu? --      Excl. in [place]? --    No data found.  Updated Vital Signs BP 120/80 (BP Location: Right Arm)   Pulse 81   Temp 98.4 F (36.9 C) (Oral)   Ht 5' 5"  (1.651 m)   Wt 138 lb (62.6 kg)   SpO2 100%   BMI 22.96 kg/m   Visual Acuity Right Eye Distance:   Left Eye Distance:   Bilateral Distance:    Right Eye Near:   Left Eye Near:    Bilateral Near:     Physical Exam  Constitutional: She appears well-developed and well-nourished. No distress.  HENT:  Head: Normocephalic.   Eyes: Pupils are equal, round, and reactive to light.  Neck: Normal range of motion.  Cardiovascular: Normal rate.  Pulmonary/Chest: Effort normal.  Musculoskeletal:       Arms: Right upper lateral arm biceps area has a 3cm diameter fluctuant tender erythematous sebaceous cyst.  Neurological: She is alert.  Skin: Skin is warm and dry.  Nursing note and vitals reviewed.    UC Treatments / Results  Labs (all labs ordered are listed, but only abnormal results are displayed) Labs Reviewed  WOUND CULTURE    EKG None  Radiology No results found.  Procedures Procedures  Incise and drain cyst/abscess Risks and benefits of procedure explained to patient and verbal consent obtained.  Using sterile technique, applied topical refrigerant spray followed by local anesthesia with 1% lidocaine with epinephrine, and cleansed affected area with Betadine and alcohol. Identified the most fluctuant area of lesion and incised with #11 blade.  Expressed blood and purulent material.  Inserted Iodoform gauze packing.  Bandage applied.  Patient tolerated well   Medications Ordered in UC Medications - No data to display  Initial Impression / Assessment and Plan / UC Course  I have reviewed the triage vital signs and the nursing notes.  Pertinent labs & imaging results that were available during my care of the patient were reviewed by me and considered in my medical decision making (see chart for details).    Wound culture pending.  Begin doxycycline for staph coverage. Return tomorrow for packing removal and wound check.   Final Clinical Impressions(s) / UC Diagnoses   Final diagnoses:  Infected sebaceous cyst  Cellulitis of right upper extremity     Discharge Instructions     Leave bandage in place until follow-up visit tomorrow.   Keep bandage clean and dry.  May take Ibuprofen 247m, 4 tabs every 8 hours with food.      ED Prescriptions    Medication Sig Dispense Auth. Provider     doxycycline (VIBRAMYCIN) 100 MG capsule Take 1 capsule (100 mg total) by mouth 2 (two) times daily. Take with food. 20 capsule BKandra Nicolas MD  Kandra Nicolas, MD 12/09/17 2040

## 2017-12-10 ENCOUNTER — Other Ambulatory Visit: Payer: Self-pay

## 2017-12-10 ENCOUNTER — Encounter: Payer: Self-pay | Admitting: Emergency Medicine

## 2017-12-10 ENCOUNTER — Emergency Department (INDEPENDENT_AMBULATORY_CARE_PROVIDER_SITE_OTHER)
Admission: EM | Admit: 2017-12-10 | Discharge: 2017-12-10 | Disposition: A | Discharge status: Home / Self Care | Payer: BLUE CROSS/BLUE SHIELD | Source: Home / Self Care | Attending: Family Medicine | Admitting: Family Medicine

## 2017-12-10 DIAGNOSIS — Z5189 Encounter for other specified aftercare: Secondary | ICD-10-CM

## 2017-12-10 MED ORDER — IBUPROFEN 600 MG PO TABS: 600.0000 mg | ORAL_TABLET | Freq: Once | ORAL | Status: DC

## 2017-12-10 NOTE — Discharge Instructions (Signed)
°  Keep wound clean with warm water and soap. You may want to keep wound covered with a bandage until the drainage stops. You may change the bandage 2-3 times a day or more frequent if it becomes wet or dirty.  Please continue to take antibiotics as prescribed and be sure to complete entire course even if you start to feel better to ensure infection does not come back.   Please follow up in 3-5 days if not improving, sooner if significantly worsening.

## 2017-12-10 NOTE — ED Provider Notes (Signed)
[facility]    CSN: [id number] Arrival date & time: 12/10/17  1638     History   Chief Complaint Chief Complaint  Patient presents with  . Follow-up    HPI Kathy Blankenship is a 47 y.o. female.   HPI  Kathy Blankenship is a 47 y.o. female presenting to UC for a recheck of an abscess on her Right arm that was packed yesterday at [facility]. Pain has improved significantly after having I&D performed. She is taking the doxycycline as prescribed without side effects. Pain is mild at this time. No pain medication today. Denies fever, chills, n/v/d.   Past Medical History:  Diagnosis Date  . Fibroids   . HSV-2 (herpes simplex virus 2) infection   . Left ovarian cyst     Patient Active Problem List   Diagnosis Date Noted  . Urticaria 03/03/2017  . Muscle cramp 08/26/2016  . Onychomycosis 08/26/2016  . Other fatigue 08/26/2016  . Irregular menstrual cycle 03/26/2016  . Vitamin D deficiency 01/26/2014  . Numbness and tingling in left upper extremity 01/14/2014  . Hyperlipidemia 12/20/2013  . TIA (transient ischemic attack) 12/20/2013  . Seasonal allergies 12/20/2013  . SINUSITIS, CHRONIC 04/27/2008  . ALLERGIC RHINITIS 04/19/2008  . RHINITIS 03/08/2008  . DYSPNEA 03/08/2008    Past Surgical History:  Procedure Laterality Date  . COLONOSCOPY  2006  . MOLE REMOVAL  1995    OB History    Gravida  0   Para  0   Term      Preterm      AB      Living  0     SAB      TAB      Ectopic      Multiple      Live Births               Home Medications    Prior to Admission medications   Medication Sig Start Date End Date Taking? Authorizing Provider  doxycycline (VIBRAMYCIN) 100 MG capsule Take 1 capsule (100 mg total) by mouth 2 (two) times daily. Take with food. 12/09/17   Kandra Nicolas, MD  gabapentin (NEURONTIN) 300 MG capsule Take 1 capsule (300 mg total) by mouth at bedtime. 10/08/17   Emeterio Reeve, DO    Family History Family History    Problem Relation Age of Onset  . Rheum arthritis Father   . Heart attack Father   . Hypertension Mother     Social History Social History   Tobacco Use  . Smoking status: Never Smoker  . Smokeless tobacco: Never Used  Substance Use Topics  . Alcohol use: No  . Drug use: No     Allergies   Lamisil [terbinafine] and Terbinafine   Review of Systems Review of Systems  Constitutional: Negative for chills and fever.  Gastrointestinal: Negative for diarrhea, nausea and vomiting.  Skin: Positive for wound. Negative for color change.     Physical Exam Triage Vital Signs ED Triage Vitals  Enc Vitals Group     BP 12/10/17 1647 115/78     Pulse Rate 12/10/17 1647 85     Resp --      Temp --      Temp src --      SpO2 --      Weight --      Height --      Head Circumference --      Peak Flow --  Pain Score 12/10/17 1648 2     Pain Loc --      Pain Edu? --      Excl. in [place]? --    No data found.  Updated Vital Signs BP 115/78 (BP Location: Left Arm)   Pulse 85   Visual Acuity Right Eye Distance:   Left Eye Distance:   Bilateral Distance:    Right Eye Near:   Left Eye Near:    Bilateral Near:     Physical Exam  Constitutional: She is oriented to person, place, and time. She appears well-developed and well-nourished. No distress.  HENT:  Head: Normocephalic and atraumatic.  Mouth/Throat: Oropharynx is clear and moist.  Eyes: EOM are normal.  Neck: Normal range of motion.  Cardiovascular: Normal rate.  Pulmonary/Chest: Effort normal.  Musculoskeletal: Normal range of motion.  Neurological: She is alert and oriented to person, place, and time.  Skin: Skin is warm and dry. She is not diaphoretic.     Right upper arm: well healing abscess with packing in place, small amount of purulent discharge. Faint erythema. Mildly tender.  Psychiatric: She has a normal mood and affect. Her behavior is normal.  Nursing note and vitals reviewed.    UC Treatments  / Results  Labs (all labs ordered are listed, but only abnormal results are displayed) Labs Reviewed - No data to display  EKG None  Radiology No results found.  Procedures Procedures (including critical care time)  Medications Ordered in UC Medications  ibuprofen (ADVIL,MOTRIN) tablet 600 mg (has no administration in time range)    Initial Impression / Assessment and Plan / UC Course  I have reviewed the triage vital signs and the nursing notes.  Pertinent labs & imaging results that were available during my care of the patient were reviewed by me and considered in my medical decision making (see chart for details).     Abscess appears to be healing well. Packing removed and new bandage applied.  Final Clinical Impressions(s) / UC Diagnoses   Final diagnoses:  Abscess re-check     Discharge Instructions      Keep wound clean with warm water and soap. You may want to keep wound covered with a bandage until the drainage stops. You may change the bandage 2-3 times a day or more frequent if it becomes wet or dirty.  Please continue to take antibiotics as prescribed and be sure to complete entire course even if you start to feel better to ensure infection does not come back.   Please follow up in 3-5 days if not improving, sooner if significantly worsening.     ED Prescriptions    None     Controlled Substance Prescriptions [place] Controlled Substance Registry consulted? Not Applicable   Tyrell Antonio 12/10/17 1705

## 2017-12-10 NOTE — ED Triage Notes (Signed)
Pt in for f/u on right arm. States she changed the bandage and cleaned the area. Denies drainage or pain.

## 2017-12-12 ENCOUNTER — Telehealth: Payer: Self-pay

## 2017-12-12 LAB — WOUND CULTURE
MICRO NUMBER:: 90841124
RESULT:: NO GROWTH
SPECIMEN QUALITY: ADEQUATE

## 2017-12-12 NOTE — Telephone Encounter (Signed)
Unable to leave message VM full.

## 2017-12-13 NOTE — Telephone Encounter (Signed)
Pt notified of neg wcx results.

## 2018-07-13 ENCOUNTER — Encounter: Payer: Self-pay | Admitting: Osteopathic Medicine

## 2018-07-13 ENCOUNTER — Ambulatory Visit (INDEPENDENT_AMBULATORY_CARE_PROVIDER_SITE_OTHER): Payer: BLUE CROSS/BLUE SHIELD | Admitting: Osteopathic Medicine

## 2018-07-13 VITALS — BP 107/70 | HR 73 | Temp 98.1°F | Wt 156.7 lb

## 2018-07-13 DIAGNOSIS — M791 Myalgia, unspecified site: Secondary | ICD-10-CM | POA: Diagnosis not present

## 2018-07-13 DIAGNOSIS — M79605 Pain in left leg: Secondary | ICD-10-CM | POA: Diagnosis not present

## 2018-07-13 DIAGNOSIS — M79604 Pain in right leg: Secondary | ICD-10-CM

## 2018-07-13 NOTE — Patient Instructions (Signed)
We will go ahead and get blood work again to check for possible electrolyte abnormalities such as magnesium, potassium, calcium which may cause leg issues but I doubt that this is the case since her not really having cramping or spasms.  I think taking the Aleve as needed is fine.    Would add compression stockings up to the knee, this may also help with the veins.  You can schedule a visit with Dr. Darene Lamer to talk about something called sclerotherapy, which will treat the superficial spider veins.

## 2018-07-13 NOTE — Progress Notes (Signed)
HPI: Kathy Blankenship is a 48 y.o. female who  has a past medical history of Fibroids, HSV-2 (herpes simplex virus 2) infection, and Left ovarian cyst.  she presents to [facility] today, 07/13/18,  for chief complaint of:  Leg pain   . Aching pains in legs: bilateral, upper thighs, thinks may be "busted veins," reports tenderness to the touch esp areas of spider veins. She stands a lot at work hard floors, also stands on mat. Works as Secretary/administrator. OTC Aleve is really helpful.  . Ongoing since at least 08/2016, labs all WNL at that time, pt declined meds       At today's visit 07/13/18 ... PMH, PSH, FH reviewed and updated as needed.  Current medication list and allergy/intolerance hx reviewed and updated as needed. (See remainder of HPI, ROS, Phys Exam below)          ASSESSMENT/PLAN: The primary encounter diagnosis was Pain in both lower extremities. A diagnosis of Muscle ache was also pertinent to this visit.   Orders Placed This Encounter  Procedures  . COMPLETE METABOLIC PANEL WITH GFR  . Magnesium  . Vitamin B12     No orders of the defined types were placed in this encounter.   Patient Instructions  We will go ahead and get blood work again to check for possible electrolyte abnormalities such as magnesium, potassium, calcium which may cause leg issues but I doubt that this is the case since her not really having cramping or spasms.  I think taking the Aleve as needed is fine.    Would add compression stockings up to the knee, this may also help with the veins.  You can schedule a visit with Dr. Darene Lamer to talk about something called sclerotherapy, which will treat the superficial spider veins.        Follow-up plan: Return if symptoms worsen or fail to improve -get a visit with Dr. Darene Lamer to discuss treatment of vein  issues.                                                 ################################################# ################################################# ################################################# #################################################    No outpatient medications have been marked as taking for the 07/13/18 encounter (Office Visit) with Emeterio Reeve, DO.    Allergies  Allergen Reactions  . Lamisil [Terbinafine] Rash  . Terbinafine Rash       Review of Systems:  Constitutional: No recent illness  Cardiac: No  chest pain  Respiratory:  No  shortness of breath  Gastrointestinal: No  abdominal pain  Musculoskeletal: No new myalgia/arthralgia  Skin: No  Rash, +spider veins bilateral legs   Hem/Onc: No  easy bruising/bleeding  Neurologic: No  weakness, No  Dizziness   Exam:  BP 107/70 (BP Location: Left Arm, Patient Position: Sitting, Cuff Size: Normal)   Pulse 73   Temp 98.1 F (36.7 C) (Oral)   Wt 156 lb 11.2 oz (71.1 kg)   BMI 26.08 kg/m   Constitutional: VS see above. General Appearance: alert, well-developed, well-nourished, NAD  Eyes: Normal lids and conjunctive, non-icteric sclera  Ears, Nose, Mouth, Throat: MMM, Normal external inspection ears/nares/mouth/lips/gums.  Neck: No masses, trachea midline.   Respiratory: Normal respiratory effort. no wheeze, no rhonchi, no rales  Cardiovascular: S1/S2 normal, no murmur, no rub/gallop auscultated. RRR.   Musculoskeletal: Gait normal. Symmetric  and independent movement of all extremities, neg SLR, no spinal tenderness  Neurological: Normal balance/coordination. No tremor.  Skin: warm, dry, intact.   Psychiatric: Normal judgment/insight. Normal mood and affect. Oriented x3.       Visit summary with medication list and pertinent instructions was printed for patient to review, patient was advised to alert [place] if any updates are  needed. All questions at time of visit were answered - patient instructed to contact office with any additional concerns. ER/RTC precautions were reviewed with the patient and understanding verbalized.     Please note: voice recognition software was used to produce this document, and typos may escape review. Please contact Dr. Sheppard Coil for any needed clarifications.    Follow up plan: Return if symptoms worsen or fail to improve -get a visit with Dr. Darene Lamer to discuss treatment of vein issues.

## 2018-07-14 LAB — COMPLETE METABOLIC PANEL WITH GFR
AG Ratio: 1.6 (calc) (ref 1.0–2.5)
ALBUMIN MSPROF: 4.2 g/dL (ref 3.6–5.1)
ALKALINE PHOSPHATASE (APISO): 69 U/L (ref 31–125)
ALT: 21 U/L (ref 6–29)
AST: 18 U/L (ref 10–35)
BUN: 8 mg/dL (ref 7–25)
CO2: 31 mmol/L (ref 20–32)
CREATININE: 0.67 mg/dL (ref 0.50–1.10)
Calcium: 9.5 mg/dL (ref 8.6–10.2)
Chloride: 110 mmol/L (ref 98–110)
GFR, Est African American: 123 mL/min/{1.73_m2} (ref >=60)
GFR, Est Non African American: 106 mL/min/{1.73_m2} (ref >=60)
GLUCOSE: 81 mg/dL (ref 65–99)
Globulin: 2.6 g/dL (calc) (ref 1.9–3.7)
Potassium: 4 mmol/L (ref 3.5–5.3)
Sodium: 146 mmol/L (ref 135–146)
Total Bilirubin: 0.6 mg/dL (ref 0.2–1.2)
Total Protein: 6.8 g/dL (ref 6.1–8.1)

## 2018-07-14 LAB — VITAMIN B12: Vitamin B-12: 463 pg/mL (ref 200–1100)

## 2018-07-14 LAB — MAGNESIUM: MAGNESIUM: 1.9 mg/dL (ref 1.5–2.5)

## 2018-10-12 ENCOUNTER — Encounter: Payer: BLUE CROSS/BLUE SHIELD | Admitting: Osteopathic Medicine

## 2018-10-22 ENCOUNTER — Ambulatory Visit (INDEPENDENT_AMBULATORY_CARE_PROVIDER_SITE_OTHER): Payer: BLUE CROSS/BLUE SHIELD | Admitting: Osteopathic Medicine

## 2018-10-22 ENCOUNTER — Encounter: Payer: Self-pay | Admitting: Osteopathic Medicine

## 2018-10-22 VITALS — BP 111/68 | HR 65 | Temp 98.6°F | Wt 158.6 lb

## 2018-10-22 DIAGNOSIS — Z0000 Encounter for general adult medical examination without abnormal findings: Secondary | ICD-10-CM

## 2018-10-22 DIAGNOSIS — G2581 Restless legs syndrome: Secondary | ICD-10-CM | POA: Diagnosis not present

## 2018-10-22 DIAGNOSIS — Z1239 Encounter for other screening for malignant neoplasm of breast: Secondary | ICD-10-CM

## 2018-10-22 MED ORDER — CICLOPIROX 8 % EX SOLN: Freq: Every day | CUTANEOUS | 0 refills | Status: DC

## 2018-10-22 NOTE — Patient Instructions (Addendum)
General Preventive Care  Most recent routine screening lipids/other labs: ordered today.   Everyone should have blood pressure checked once per year.   Tobacco: don't!   Alcohol: responsible moderation is ok for most adults - if you have concerns about your alcohol intake, please talk to me!   Exercise: as tolerated to reduce risk of cardiovascular disease and diabetes. Strength training will also prevent osteoporosis.   Mental health: if need for mental health care (medicines, counseling, other), or concerns about moods, please let me know!   Sexual health: if need for STD testing, or if concerns with libido/pain problems, please let me know!   Advanced Directive: Living Will and/or Healthcare Power of Attorney recommended for all adults, regardless of age or health.  Vaccines  Flu vaccine: recommended for almost everyone, every fall.   Shingles vaccine: Shingrix recommended after age 27.   Pneumonia vaccines: Prevnar and Pneumovax recommended after age 58.   Tetanus booster: Tdap recommended every 10 years. Due 11/2023 Cancer screenings   Colon cancer screening: recommended for everyone at age 68-50. Please contact your insurance to see if you're covered at age 56.   Breast cancer screening: mammogram recommended at age 61 every other year at least, and annually after age 73.   Cervical cancer screening: Pap due 09/2022.   Lung cancer screening: not needed for non-smokers Infection screenings . HIV, Gonorrhea/Chlamydia: screening as needed . Hepatitis C: recommended for anyone born 78-1963 . TB: certain at-risk populations, or depending on work requirements and/or travel history Other  Bone Density Test: recommended for women at age 54

## 2018-10-22 NOTE — Progress Notes (Signed)
HPI: Kathy Blankenship is a 48 y.o. female who  has a past medical history of Fibroids, HSV-2 (herpes simplex virus 2) infection, and Left ovarian cyst.  she presents to [facility] today, 10/22/18,  for chief complaint of: Annual physical     Patient here for annual physical / wellness exam.  See preventive care reviewed as below.  Recent labs reviewed in detail with the patient.   Additional concerns today include:  Leg pains intermittently, worse in evenings, concerned about RLS.     Past medical, surgical, social and family history reviewed:  Patient Active Problem List   Diagnosis Date Noted  . Urticaria 03/03/2017  . Muscle cramp 08/26/2016  . Onychomycosis 08/26/2016  . Other fatigue 08/26/2016  . Irregular menstrual cycle 03/26/2016  . Vitamin D deficiency 01/26/2014  . Numbness and tingling in left upper extremity 01/14/2014  . Hyperlipidemia 12/20/2013  . TIA (transient ischemic attack) 12/20/2013  . Seasonal allergies 12/20/2013  . SINUSITIS, CHRONIC 04/27/2008  . ALLERGIC RHINITIS 04/19/2008  . RHINITIS 03/08/2008  . DYSPNEA 03/08/2008    Past Surgical History:  Procedure Laterality Date  . COLONOSCOPY  2006  . MOLE REMOVAL  1995    Social History   Tobacco Use  . Smoking status: Never Smoker  . Smokeless tobacco: Never Used  Substance Use Topics  . Alcohol use: No    Family History  Problem Relation Age of Onset  . Rheum arthritis Father   . Heart attack Father   . Hypertension Mother      Current medication list and allergy/intolerance information reviewed:    Current Outpatient Medications  Medication Sig Dispense Refill  . ciclopirox (PENLAC) 8 % solution Apply topically at bedtime. Apply over nail and surrounding skin. Apply daily over previous coat. After seven (7) days, may remove with alcohol and continue cycle. 6.6 mL 0   No current facility-administered medications for this visit.     Allergies   Allergen Reactions  . Lamisil [Terbinafine] Rash  . Terbinafine Rash      Review of Systems:  Constitutional:  No  fever, no chills, No recent illness, No unintentional weight changes. No significant fatigue.   HEENT: No  headache, no vision change, no hearing change, No sore throat, No  sinus pressure  Cardiac: No  chest pain, No  pressure, No palpitations  Respiratory:  No  shortness of breath. No  Cough  Gastrointestinal: No  abdominal pain, No  nausea, No  vomiting,  No  blood in stool, No  diarrhea, No  constipation   Musculoskeletal: +myalgia/arthralgia as above   Skin: No  Rash, No other wounds/concerning lesions  Genitourinary: No  incontinence, No  abnormal genital bleeding, No abnormal genital discharge  Hem/Onc: No  easy bruising/bleeding, No  abnormal lymph node  Endocrine: No cold intolerance,  No heat intolerance. No polyuria/polydipsia/polyphagia   Neurologic: No  weakness, No  dizziness, No  slurred speech/focal weakness/facial droop  Psychiatric: No  concerns with depression, No  concerns with anxiety, No sleep problems, No mood problems  Exam:  BP 111/68 (BP Location: Left Arm, Patient Position: Sitting, Cuff Size: Normal)   Pulse 65   Temp 98.6 F (37 C) (Oral)   Wt 158 lb 9.6 oz (71.9 kg)   BMI 26.39 kg/m   Constitutional: VS see above. General Appearance: alert, well-developed, well-nourished, NAD  Eyes: Normal lids and conjunctive, non-icteric sclera  Ears, Nose, Mouth, Throat: MMM, Normal external inspection ears/nares/mouth/lips/gums. TM normal bilaterally.  Neck: No masses, trachea midline. No thyroid enlargement. No tenderness/mass appreciated. No lymphadenopathy  Respiratory: Normal respiratory effort. no wheeze, no rhonchi, no rales  Cardiovascular: S1/S2 normal, no murmur, no rub/gallop auscultated. RRR. No lower extremity edema.   Gastrointestinal: Nontender, no masses. No hepatomegaly, no splenomegaly. No hernia appreciated. Bowel  sounds normal. Rectal exam deferred.   Musculoskeletal: Gait normal. No clubbing/cyanosis of digits.   Neurological: Normal balance/coordination. No tremor. No cranial nerve deficit on limited exam. Motor and sensation intact and symmetric. Cerebellar reflexes intact.   Skin: warm, dry, intact. No rash/ulcer. No concerning nevi or subq nodules on limited exam.    Psychiatric: Normal judgment/insight. Normal mood and affect. Oriented x3.       ASSESSMENT/PLAN: The primary encounter diagnosis was Annual physical exam. Diagnoses of Breast cancer screening and Restless legs were also pertinent to this visit.   Advised vit D   Consider gabapentin for RLS type pains  Orders Placed This Encounter  Procedures  . MM 3D SCREEN BREAST BILATERAL  . CBC  . Lipid panel  . COMPLETE METABOLIC PANEL WITH GFR  . VITAMIN D 25 Hydroxy (Vit-D Deficiency, Fractures)  . Fe+TIBC+Fer    Meds ordered this encounter  Medications  . ciclopirox (PENLAC) 8 % solution    Sig: Apply topically at bedtime. Apply over nail and surrounding skin. Apply daily over previous coat. After seven (7) days, may remove with alcohol and continue cycle.    Dispense:  6.6 mL    Refill:  0    Patient Instructions  General Preventive Care  Most recent routine screening lipids/other labs: ordered today.   Everyone should have blood pressure checked once per year.   Tobacco: don't!   Alcohol: responsible moderation is ok for most adults - if you have concerns about your alcohol intake, please talk to me!   Exercise: as tolerated to reduce risk of cardiovascular disease and diabetes. Strength training will also prevent osteoporosis.   Mental health: if need for mental health care (medicines, counseling, other), or concerns about moods, please let me know!   Sexual health: if need for STD testing, or if concerns with libido/pain problems, please let me know!   Advanced Directive: Living Will and/or Healthcare Power  of Attorney recommended for all adults, regardless of age or health.  Vaccines  Flu vaccine: recommended for almost everyone, every fall.   Shingles vaccine: Shingrix recommended after age 78.   Pneumonia vaccines: Prevnar and Pneumovax recommended after age 98.   Tetanus booster: Tdap recommended every 10 years. Due 11/2023 Cancer screenings   Colon cancer screening: recommended for everyone at age 37-50. Please contact your insurance to see if you're covered at age 44.   Breast cancer screening: mammogram recommended at age 54 every other year at least, and annually after age 17.   Cervical cancer screening: Pap due 09/2022.   Lung cancer screening: not needed for non-smokers Infection screenings . HIV, Gonorrhea/Chlamydia: screening as needed . Hepatitis C: recommended for anyone born 78-1963 . TB: certain at-risk populations, or depending on work requirements and/or travel history Other  Bone Density Test: recommended for women at age 25       Visit summary with medication list and pertinent instructions was printed for patient to review. All questions at time of visit were answered - patient instructed to contact office with any additional concerns or updates. ER/RTC precautions were reviewed with the patient.     Please note: voice recognition software was used to produce  this document, and typos may escape review. Please contact Dr. Sheppard Coil for any needed clarifications.     Follow-up plan: Return in about 1 year (around 10/22/2019) for annual physical, sooner if needed .

## 2018-10-23 LAB — LIPID PANEL
Cholesterol: 192 mg/dL (ref <200)
HDL: 60 mg/dL (ref >=50)
LDL Cholesterol (Calc): 118 mg/dL (calc) — ABNORMAL HIGH
Non-HDL Cholesterol (Calc): 132 mg/dL (calc) — ABNORMAL HIGH (ref <130)
Total CHOL/HDL Ratio: 3.2 (calc) (ref <5.0)
Triglycerides: 52 mg/dL (ref <150)

## 2018-10-23 LAB — COMPLETE METABOLIC PANEL WITH GFR
AG Ratio: 1.6 (calc) (ref 1.0–2.5)
ALT: 25 U/L (ref 6–29)
AST: 17 U/L (ref 10–35)
Albumin: 4.4 g/dL (ref 3.6–5.1)
Alkaline phosphatase (APISO): 61 U/L (ref 31–125)
BUN: 12 mg/dL (ref 7–25)
CO2: 25 mmol/L (ref 20–32)
Calcium: 9.9 mg/dL (ref 8.6–10.2)
Chloride: 107 mmol/L (ref 98–110)
Creat: 0.77 mg/dL (ref 0.50–1.10)
GFR, Est African American: 108 mL/min/{1.73_m2} (ref >=60)
GFR, Est Non African American: 93 mL/min/{1.73_m2} (ref >=60)
Globulin: 2.8 g/dL (calc) (ref 1.9–3.7)
Glucose, Bld: 89 mg/dL (ref 65–139)
Potassium: 4.1 mmol/L (ref 3.5–5.3)
Sodium: 143 mmol/L (ref 135–146)
Total Bilirubin: 0.8 mg/dL (ref 0.2–1.2)
Total Protein: 7.2 g/dL (ref 6.1–8.1)

## 2018-10-23 LAB — CBC
HCT: 38.2 % (ref 35.0–45.0)
Hemoglobin: 12.9 g/dL (ref 11.7–15.5)
MCH: 28.4 pg (ref 27.0–33.0)
MCHC: 33.8 g/dL (ref 32.0–36.0)
MCV: 84 fL (ref 80.0–100.0)
MPV: 10.9 fL (ref 7.5–12.5)
Platelets: 242 10*3/uL (ref 140–400)
RBC: 4.55 10*6/uL (ref 3.80–5.10)
RDW: 13.4 % (ref 11.0–15.0)
WBC: 5.2 10*3/uL (ref 3.8–10.8)

## 2018-10-29 ENCOUNTER — Other Ambulatory Visit: Payer: Self-pay

## 2018-10-29 ENCOUNTER — Ambulatory Visit (INDEPENDENT_AMBULATORY_CARE_PROVIDER_SITE_OTHER): Payer: BC Managed Care – PPO

## 2018-10-29 DIAGNOSIS — Z1239 Encounter for other screening for malignant neoplasm of breast: Secondary | ICD-10-CM | POA: Diagnosis not present

## 2019-01-14 ENCOUNTER — Telehealth: Payer: Self-pay

## 2019-01-14 MED ORDER — GABAPENTIN 300 MG PO CAPS: 300.0000 mg | ORAL_CAPSULE | Freq: Three times a day (TID) | ORAL | 1 refills | Status: DC | PRN

## 2019-01-14 NOTE — Telephone Encounter (Signed)
Pt left a vm msg stating she continues to have pain and uncomfortableness in her legs. As per pt, she discussed this issue with provider in Feb and May. Pt is requesting a rx for muscle relaxant for relief of discomfort on her legs. Requesting rx to be sent to [facility]. Pls advise, thanks.

## 2019-01-14 NOTE — Telephone Encounter (Signed)
At last visit, we had discussed possibly starting gabapentin for RLS type symptoms.  I sent this to the pharmacy, if this is not helpful, she needs a visit either virtually or in office, or can get second opinion from sports medicine.

## 2019-01-15 NOTE — Telephone Encounter (Signed)
Unable to leave a message, voicemail is full.

## 2019-01-18 NOTE — Telephone Encounter (Signed)
Patient notified of prescription and if this does not help, she needs to schedule an appointment.

## 2019-06-11 ENCOUNTER — Telehealth: Payer: Self-pay

## 2019-06-11 NOTE — Telephone Encounter (Signed)
Pt left a vm msg stating she continues to have issues with her legs. As per pt, she is having high degrees of uncomfortableness in her legs. She mentioned that no issues with mobility. Requesting feed back from provider. Pls advise, thanks.

## 2019-06-12 NOTE — Telephone Encounter (Signed)
Last saw patient 09/2018, she should schedule visit w/ me or can see sports med for consult on pain issues

## 2019-06-14 NOTE — Telephone Encounter (Signed)
Appointment has been made with [facility]. No questions.

## 2019-06-16 ENCOUNTER — Other Ambulatory Visit: Payer: Self-pay

## 2019-06-16 ENCOUNTER — Ambulatory Visit (INDEPENDENT_AMBULATORY_CARE_PROVIDER_SITE_OTHER): Payer: BC Managed Care – PPO

## 2019-06-16 ENCOUNTER — Ambulatory Visit (INDEPENDENT_AMBULATORY_CARE_PROVIDER_SITE_OTHER): Payer: BC Managed Care – PPO | Admitting: Sports Medicine

## 2019-06-16 DIAGNOSIS — M2550 Pain in unspecified joint: Secondary | ICD-10-CM

## 2019-06-16 DIAGNOSIS — R202 Paresthesia of skin: Secondary | ICD-10-CM

## 2019-06-16 DIAGNOSIS — M79602 Pain in left arm: Secondary | ICD-10-CM | POA: Diagnosis not present

## 2019-06-16 DIAGNOSIS — M79601 Pain in right arm: Secondary | ICD-10-CM

## 2019-06-16 DIAGNOSIS — M4317 Spondylolisthesis, lumbosacral region: Secondary | ICD-10-CM | POA: Diagnosis not present

## 2019-06-16 MED ORDER — MELOXICAM 15 MG PO TABS: ORAL_TABLET | ORAL | 3 refills | Status: DC

## 2019-06-16 NOTE — Assessment & Plan Note (Signed)
Kathy Blankenship has joint aches all over, morning stiffness, she has a strong family history of rheumatoid arthritis in her father. Pulling the trigger for rheumatoid testing.

## 2019-06-16 NOTE — Assessment & Plan Note (Addendum)
This pleasant 49 year old female has also had a fairly long history of numbness and tingling as well as pain in both thighs and her right shin. Minimal back pain. She does feel as though these pains come and go with her cycles which fortunately are becoming less frequent as she approaches menopause. Historically she does have very painful cycles. Though this certainly could be related to lumbar spinal stenosis, she likely has some endometriosis, and the pelvic wall endometrial deposits are likely irritating her lumbosacral plexus. We will start meloxicam, get some lumbar spine x-rays, she is going to start an exercise regimen, I am also going to add lumbar spine rehab exercises, return to see me in about a month.  There is moderate to severe lumbar spondylolisthesis, this occurs when vertebrae slip forward on each other, this in turn pinches the nerves going down the legs.  My advice is to actually do formal physical therapy rather than just home exercises.

## 2019-06-16 NOTE — Progress Notes (Addendum)
    Procedures performed today:    None.  Independent interpretation of tests performed by another provider:   None.  Impression and Recommendations:    Polyarthralgia Kathy Blankenship has joint aches all over, morning stiffness, she has a strong family history of rheumatoid arthritis in her father. Pulling the trigger for rheumatoid testing.  Spondylolisthesis at L5-S1 level This pleasant 50 year old female has also had a fairly long history of numbness and tingling as well as pain in both thighs and her right shin. Minimal back pain. She does feel as though these pains come and go with her cycles which fortunately are becoming less frequent as she approaches menopause. Historically she does have very painful cycles. Though this certainly could be related to lumbar spinal stenosis, she likely has some endometriosis, and the pelvic wall endometrial deposits are likely irritating her lumbosacral plexus. We will start meloxicam, get some lumbar spine x-rays, she is going to start an exercise regimen, I am also going to add lumbar spine rehab exercises, return to see me in about a month.  There is moderate to severe lumbar spondylolisthesis, this occurs when vertebrae slip forward on each other, this in turn pinches the nerves going down the legs.  My advice is to actually do formal physical therapy rather than just home exercises.    ___________________________________________ Gwen Her. Dianah Field, M.D., ABFM., CAQSM. Primary Care and [place] Instructor of [place] of [facility]

## 2019-07-14 ENCOUNTER — Other Ambulatory Visit: Payer: Self-pay

## 2019-07-14 ENCOUNTER — Encounter: Payer: Self-pay | Admitting: Sports Medicine

## 2019-07-14 ENCOUNTER — Ambulatory Visit (INDEPENDENT_AMBULATORY_CARE_PROVIDER_SITE_OTHER): Payer: BC Managed Care – PPO | Admitting: Sports Medicine

## 2019-07-14 DIAGNOSIS — M4317 Spondylolisthesis, lumbosacral region: Secondary | ICD-10-CM | POA: Diagnosis not present

## 2019-07-14 NOTE — Progress Notes (Signed)
    Procedures performed today:    None.  Independent interpretation of tests performed by another provider:   I did personally review her lumbar spine x-rays which show grade 2 L5-S1 spondylolisthesis.  Impression and Recommendations:    Spondylolisthesis at L5-S1 level This pleasant 48 year old female returns, she has grade 2 L5-S1 spondylolisthesis with bilateral radicular symptoms. She is only a little bit better but has not really been taking her meloxicam. She has been doing some stretching but no strengthening. We are going to proceed with aggressive formal physical therapy for core strengthening, she will do her meloxicam on a more regular basis and return to see me in 6 weeks, MRI for interventional planning if no better. Should she require surgical intervention it would likely be an L5-S1 TLIF.    ___________________________________________ Gwen Her. Dianah Field, M.D., ABFM., CAQSM. Primary Care and [place] Instructor of [place] of [facility] [employer]

## 2019-07-14 NOTE — Assessment & Plan Note (Signed)
This pleasant 49 year old female returns, she has grade 2 L5-S1 spondylolisthesis with bilateral radicular symptoms. She is only a little bit better but has not really been taking her meloxicam. She has been doing some stretching but no strengthening. We are going to proceed with aggressive formal physical therapy for core strengthening, she will do her meloxicam on a more regular basis and return to see me in 6 weeks, MRI for interventional planning if no better. Should she require surgical intervention it would likely be an L5-S1 TLIF.

## 2019-08-05 ENCOUNTER — Other Ambulatory Visit: Payer: Self-pay

## 2019-08-05 ENCOUNTER — Ambulatory Visit (INDEPENDENT_AMBULATORY_CARE_PROVIDER_SITE_OTHER): Payer: BC Managed Care – PPO | Admitting: Rehabilitative and Restorative Service Providers"

## 2019-08-05 ENCOUNTER — Encounter: Payer: Self-pay | Admitting: Rehabilitative and Restorative Service Providers"

## 2019-08-05 DIAGNOSIS — M5440 Lumbago with sciatica, unspecified side: Secondary | ICD-10-CM

## 2019-08-05 DIAGNOSIS — M6281 Muscle weakness (generalized): Secondary | ICD-10-CM

## 2019-08-05 NOTE — Patient Instructions (Signed)
Access Code: [email or web address]  URL: [email or web address]  Date: 08/05/2019  Prepared by: Rudell Cobb   Exercises Child's Pose Stretch - 2 reps - 1 sets - 60 seconds hold - 2x daily - 7x weekly Child's Pose with Sidebending - 2 reps - 1 sets - 30-60 seconds hold - 2x daily - 7x weekly Marching Bridge - 10 reps - 1 sets - 2x daily - 7x weekly Sidelying Hip Abduction - 10 reps - 1 sets - 2x daily - 7x weekly

## 2019-08-05 NOTE — Therapy (Signed)
[place], [place], [address] Phone: [phone]   Fax:  [phone]  Physical Therapy Evaluation  Patient Details  Name: Kathy Blankenship MRN: [id number] Date of Birth: 1970-12-30 Referring Provider (PT): Aundria Mems, MD   Encounter Date: 08/05/2019  PT End of Session - 08/05/19 1736    Visit Number  1    Number of Visits  12    Date for PT Re-Evaluation  09/16/19    PT Start Time  8938    PT Stop Time  1730    PT Time Calculation (min)  45 min       Past Medical History:  Diagnosis Date  . Fibroids   . HSV-2 (herpes simplex virus 2) infection   . Left ovarian cyst     Past Surgical History:  Procedure Laterality Date  . COLONOSCOPY  2006  . MOLE REMOVAL  1995    There were no vitals filed for this visit.   Subjective Assessment - 08/05/19 1646    Subjective  The patient reports onset of back pain that has progressively gotten worse over the past few months.  She works at a [employer] and has a task to lift coins and that can aggravate low back (weights approximately 3-7 lbs, varies how frequently she lifts boxes).  The pain began in her legs (L side first, now both, concentrated in anterior thigh region).  She gets dull aching up to sharp pains in the legs at times.  She sleeps well and has no pain with walking.    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    Patient Stated Goals  Reduce pain in legs    Currently in Pain?  Yes    Pain Score  0-No pain   aware of "discomfort" no pain at rest   Pain Location  Back    Pain Orientation  Lower    Pain Descriptors / Indicators  Discomfort    Pain Type  Acute pain    Pain Radiating Towards  bilateral anterior thighs    Pain Onset  More than a month ago    Pain Frequency  Intermittent    Aggravating Factors   twisting, repetitive lifting    Pain Relieving Factors  adjusting movements, pain is  beginning to linger longer    Effect of Pain on Daily Activities  modified work activities (bending less         [facility] PT Assessment - 08/05/19 1700      Assessment   Medical Diagnosis  spodylolisthesis at L5-S1 level    Referring Provider (PT)  Aundria Mems, MD    Onset Date/Surgical Date  --   approximately 2 months ago   Hand Dominance  Right      Precautions   Precautions  None      Restrictions   Weight Bearing Restrictions  No      Balance Screen   Has the patient fallen in the past 6 months  No    Has the patient had a decrease in activity level because of a fear of falling?   No    Is the patient reluctant to leave their home because of a fear of falling?   No      Home Social worker  Private residence    Living Arrangements  Spouse/significant other      Prior Function   Level of [place]  Observation/Other Assessments   Focus on Therapeutic Outcomes (FOTO)   76% (24% limitation)      Sensation   Light Touch  --   occasional tingling in bilat feet   Additional Comments  gets some tingling worse after waking up in the morning, pain radiates from anterior thighs and into shins      ROM / Strength   AROM / PROM / Strength  AROM;Strength      AROM   Overall AROM   Within functional limits for tasks performed    Overall AROM Comments  fulcrum of movement in low thoracic spine with pain in low thoracic spine      Strength   Overall Strength  Deficits    Overall Strength Comments  discomfort in low back as evaluation progresses    Strength Assessment Site  Hip;Knee;Ankle    Right/Left Hip  Right;Left    Right Hip Flexion  4-/5    Left Hip Flexion  4+/5    Right/Left Knee  Right;Left    Right Knee Flexion  4/5    Right Knee Extension  5/5    Left Knee Flexion  5/5    Left Knee Extension  5/5    Right/Left Ankle  Right;Left    Right Ankle Dorsiflexion  5/5    Right Ankle Plantar Flexion  4/5    Left Ankle  Dorsiflexion  5/5    Left Ankle Plantar Flexion  5/5      Flexibility   Soft Tissue Assessment /Muscle Length  yes   mild tightness bilateral quadriceps musculature   Hamstrings  --      Palpation   Spinal mobility  hypomobile with muscle guarding L3, L4 spinous processes with dec'd PA mobility    Palpation comment  tender and tight bilateral erector spinae (> muscle tightness on the left side)      Special Tests    Special Tests  Hip Special Tests    Hip Special Tests   Saralyn Pilar (FABER) Test;Other      Saralyn Pilar [facility]) Test   Findings  Positive    Side  Left      other   Findings  Negative    Side  Right;Left    Comments  FADIR                Objective measurements completed on examination: See above findings.      [facility] Adult PT Treatment/Exercise - 08/05/19 1811      Exercises   Exercises  Lumbar      Lumbar Exercises: Stretches   Double Knee to Chest Stretch  2 reps;20 seconds    Other Lumbar Stretch Exercise  childs pose, child's pose with lateral trunk stretch      Lumbar Exercises: Supine   Bridge  5 reps    Bridge with March  10 reps      Lumbar Exercises: Sidelying   Hip Abduction  10 reps;Right;Left      Lumbar Exercises: Prone   Straight Leg Raise  --   3 reps   Straight Leg Raises Limitations  extends at thoracolumbar region      Lumbar Exercises: Quadruped   Madcat/Old Horse  5 reps    Straight Leg Raise  --   3 reps   Straight Leg Raises Limitations  extends in low back at thoracolumbar region             PT Education - 08/05/19 1735    Education Details  HEP    Person(s) Educated  Patient    Methods  Explanation;Demonstration;Handout    Comprehension  Verbalized understanding;Returned demonstration          PT Long Term Goals - 08/05/19 1737      PT LONG TERM GOAL #1   Title  The patient will be indep with HEP for core stabilization, flexibility and LE strengthening.    Time  6    Period  Weeks    Target Date   09/16/19      PT LONG TERM GOAL #2   Title  The patient will reduce functional limitation per FOTO score improving from 24% limitation to < or equal to 18% limitation.    Time  6    Period  Weeks    Target Date  09/16/19      PT LONG TERM GOAL #3   Title  The patient will improve LE strength to 5/5 for R hip flexion and abduction.    Time  6    Period  Weeks    Target Date  09/16/19      PT LONG TERM GOAL #4   Title  The patient will report reduced incidence of leg pain by 50%.    Time  6    Period  Weeks    Target Date  09/16/19      PT LONG TERM GOAL #5   Title  The patient will demonstrate proper body mechanics for lifting 10 lbs in order to perform safe lifting when needed at work.    Time  6    Period  Weeks    Target Date  09/16/19             Plan - 08/05/19 1750    Clinical Impression Statement  The patient is a 49 year old female presenting to OP physical therapy for low back pain with radiating symptoms to bilateral LEs.  She has impairments of decreased R hip flexion strength, decreased R plantar flexion strength, decreased R hip abductor strength, hypomoblity lumbar spine, sensory changes LEs (tingling intermittently), and pain in LES with back discomfort.  Patient functionally is modifying her work activities to accommodate for pain/discomfort.  PT to address deficits to reduce pain and improve mobility.    Examination-Activity Limitations  Lift    Stability/Clinical Decision Making  Stable/Uncomplicated    Clinical Decision Making  Low    Rehab Potential  Good    PT Frequency  2x / week    PT Duration  6 weeks    PT Treatment/Interventions  ADLs/Self Care Home Management;Gait training;Stair training;Functional mobility training;Therapeutic activities;Therapeutic exercise;Balance training;Neuromuscular re-education;Manual techniques;Dry needling;Taping;Traction;Electrical Stimulation;Patient/family education    PT Next Visit Plan  Progress HEP to include  greater core stability, quadriped working on core stability with hip extension (needed cues today), consider dry needling lumbar paraspinals/erector spinae and traction as needed to centralize pain.  L hip joint mobilization.    Consulted and Agree with Plan of Care  Patient       Patient will benefit from skilled therapeutic intervention in order to improve the following deficits and impairments:  Pain, Decreased strength, Postural dysfunction, Hypomobility, Increased fascial restricitons, Impaired flexibility  Visit Diagnosis: Acute bilateral low back pain with sciatica, sciatica laterality unspecified  Muscle weakness (generalized)     Problem List Patient Active Problem List   Diagnosis Date Noted  . Polyarthralgia 06/16/2019  . Spondylolisthesis at L5-S1 level 06/16/2019  . Urticaria 03/03/2017  . Muscle cramp 08/26/2016  .  Onychomycosis 08/26/2016  . Other fatigue 08/26/2016  . Irregular menstrual cycle 03/26/2016  . Vitamin D deficiency 01/26/2014  . Numbness and tingling in left upper extremity 01/14/2014  . Hyperlipidemia 12/20/2013  . TIA (transient ischemic attack) 12/20/2013  . Seasonal allergies 12/20/2013  . SINUSITIS, CHRONIC 04/27/2008  . ALLERGIC RHINITIS 04/19/2008  . RHINITIS 03/08/2008  . DYSPNEA 03/08/2008    [place] , PT 08/05/2019, [place], [place], [address] Phone: [phone]   Fax:  [phone]  Name: Dayona Shaheen MRN: [id number] Date of Birth: 11/29/1970

## 2019-08-11 ENCOUNTER — Ambulatory Visit (INDEPENDENT_AMBULATORY_CARE_PROVIDER_SITE_OTHER): Payer: BC Managed Care – PPO | Admitting: Physical Therapy

## 2019-08-11 ENCOUNTER — Encounter: Payer: Self-pay | Admitting: Physical Therapy

## 2019-08-11 DIAGNOSIS — M5440 Lumbago with sciatica, unspecified side: Secondary | ICD-10-CM | POA: Diagnosis not present

## 2019-08-11 DIAGNOSIS — M6281 Muscle weakness (generalized): Secondary | ICD-10-CM | POA: Diagnosis not present

## 2019-08-11 NOTE — Patient Instructions (Signed)

## 2019-08-11 NOTE — Therapy (Signed)
[place], [place], [address] Phone: [phone]   Fax:  [phone]  Physical Therapy Treatment  Patient Details  Name: Kathy Blankenship MRN: [id number] Date of Birth: [birth year] Referring Provider (PT): Aundria Mems, MD   Encounter Date: 08/11/2019  PT End of Session - 08/11/19 0849    Visit Number  2    Number of Visits  12    Date for PT Re-Evaluation  09/16/19    PT Start Time  0805    PT Stop Time  0845    PT Time Calculation (min)  40 min    Activity Tolerance  Patient tolerated treatment well;No increased pain    Behavior During Therapy  WFL for tasks assessed/performed       Past Medical History:  Diagnosis Date  . Fibroids   . HSV-2 (herpes simplex virus 2) infection   . Left ovarian cyst     Past Surgical History:  Procedure Laterality Date  . COLONOSCOPY  2006  . MOLE REMOVAL  1995    There were no vitals filed for this visit.  Subjective Assessment - 08/11/19 0806    Subjective  Pt reports she has been doing her exercises in the AM, and can tell things are loosening up.  She had some pain in her ant Lt thigh when she was getting ready this morning, but it has resolved.    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    Patient Stated Goals  Reduce pain in legs    Currently in Pain?  No/denies    Pain Score  0-No pain    Pain Onset  More than a month ago         [facility] PT Assessment - 08/11/19 0001      Assessment   Medical Diagnosis  spodylolisthesis at L5-S1 level    Referring Provider (PT)  Aundria Mems, MD    Onset Date/Surgical Date  --   approximately 2 months ago   Hand Dominance  Right      [facility] Adult PT Treatment/Exercise - 08/11/19 0001      Lumbar Exercises: Stretches   Piriformis Stretch  Right;Left;3 reps;20 seconds   modified pigeon, seated   Other Lumbar Stretch Exercise  childs pose x 20 sec x  2, child's pose with lateral trunk stretch x 15 sec each side      Lumbar Exercises: Aerobic   Nustep  L4: 73mn for warm up    PTA present to discuss progress.      Lumbar Exercises: Seated   Sit to Stand  10 reps   cues for neutral spine, mirror for feedback     Lumbar Exercises: Supine   Ab Set  5 reps;5 seconds    Clam  5 reps   with TA engaged.    Bent Knee Raise  10 reps   with TA press   Bridge  5 reps;5 seconds   with TA engaged   Bridge with March  10 reps   with TA engaged. multiple stop/star to correct form      Lumbar Exercises: Sidelying   Hip Abduction  Right;Left;10 reps   with TA engaged for stabilization of pelvis.      Lumbar Exercises: Quadruped   Madcat/Old Horse  5 reps       PT Education - 08/11/19 [phone]   Education Details  posture and body mechanics    Person(s)  Educated  Patient    Methods  Explanation;Handout    Comprehension  Verbalized understanding          PT Long Term Goals - 08/05/19 1737      PT LONG TERM GOAL #1   Title  The patient will be indep with HEP for core stabilization, flexibility and LE strengthening.    Time  6    Period  Weeks    Target Date  09/16/19      PT LONG TERM GOAL #2   Title  The patient will reduce functional limitation per FOTO score improving from 24% limitation to < or equal to 18% limitation.    Time  6    Period  Weeks    Target Date  09/16/19      PT LONG TERM GOAL #3   Title  The patient will improve LE strength to 5/5 for R hip flexion and abduction.    Time  6    Period  Weeks    Target Date  09/16/19      PT LONG TERM GOAL #4   Title  The patient will report reduced incidence of leg pain by 50%.    Time  6    Period  Weeks    Target Date  09/16/19      PT LONG TERM GOAL #5   Title  The patient will demonstrate proper body mechanics for lifting 10 lbs in order to perform safe lifting when needed at work.    Time  6    Period  Weeks    Target Date  09/16/19            Plan  - 08/11/19 0843    Clinical Impression Statement  Increased lordosis noted with sit to stand; given cues to correct to more neutral pelvis. Improved post cues. Pt tolerated exercises well, without increase in symptoms.  Progressing towards LTGs.    Examination-Activity Limitations  Lift    Stability/Clinical Decision Making  Stable/Uncomplicated    Rehab Potential  Good    PT Frequency  2x / week    PT Duration  6 weeks    PT Treatment/Interventions  ADLs/Self Care Home Management;Gait training;Stair training;Functional mobility training;Therapeutic activities;Therapeutic exercise;Balance training;Neuromuscular re-education;Manual techniques;Dry needling;Taping;Traction;Electrical Stimulation;Patient/family education    PT Next Visit Plan  Progress HEP to include greater core stability, quadriped working on core stability with hip extension. traction as needed to centralize pain.  L hip joint mobilization.    Consulted and Agree with Plan of Care  Patient       Patient will benefit from skilled therapeutic intervention in order to improve the following deficits and impairments:  Pain, Decreased strength, Postural dysfunction, Hypomobility, Increased fascial restricitons, Impaired flexibility  Visit Diagnosis: Acute bilateral low back pain with sciatica, sciatica laterality unspecified  Muscle weakness (generalized)     Problem List Patient Active Problem List   Diagnosis Date Noted  . Polyarthralgia 06/16/2019  . Spondylolisthesis at L5-S1 level 06/16/2019  . Urticaria 03/03/2017  . Muscle cramp 08/26/2016  . Onychomycosis 08/26/2016  . Other fatigue 08/26/2016  . Irregular menstrual cycle 03/26/2016  . Vitamin D deficiency 01/26/2014  . Numbness and tingling in left upper extremity 01/14/2014  . Hyperlipidemia 12/20/2013  . TIA (transient ischemic attack) 12/20/2013  . Seasonal allergies 12/20/2013  . SINUSITIS, CHRONIC 04/27/2008  . ALLERGIC RHINITIS 04/19/2008  . RHINITIS  03/08/2008  . DYSPNEA 03/08/2008   Kerin Perna, PTA 08/11/19 10:47 AM  [facility] [place] [address] [place], [place], [address] Phone: [phone]   Fax:  [phone]  Name: Yassmine Tamm MRN: [id number] Date of Birth: Jan 27, 1971

## 2019-08-16 ENCOUNTER — Ambulatory Visit (INDEPENDENT_AMBULATORY_CARE_PROVIDER_SITE_OTHER): Payer: BC Managed Care – PPO | Admitting: Rehabilitative and Restorative Service Providers"

## 2019-08-16 ENCOUNTER — Encounter: Payer: Self-pay | Admitting: Rehabilitative and Restorative Service Providers"

## 2019-08-16 ENCOUNTER — Other Ambulatory Visit: Payer: Self-pay

## 2019-08-16 DIAGNOSIS — M6281 Muscle weakness (generalized): Secondary | ICD-10-CM | POA: Diagnosis not present

## 2019-08-16 DIAGNOSIS — M5440 Lumbago with sciatica, unspecified side: Secondary | ICD-10-CM

## 2019-08-16 NOTE — Patient Instructions (Addendum)
Trigger Point Dry Needling  . What is Trigger Point Dry Needling (DN)? o DN is a physical therapy technique used to treat muscle pain and dysfunction. Specifically, DN helps deactivate muscle trigger points (muscle knots).  o A thin filiform needle is used to penetrate the skin and stimulate the underlying trigger point. The goal is for a local twitch response (LTR) to occur and for the trigger point to relax. No medication of any kind is injected during the procedure.   . What Does Trigger Point Dry Needling Feel Like?  o The procedure feels different for each individual patient. Some patients report that they do not actually feel the needle enter the skin and overall the process is not painful. Very mild bleeding may occur. However, many patients feel a deep cramping in the muscle in which the needle was inserted. This is the local twitch response.   Marland Kitchen How Will I feel after the treatment? o Soreness is normal, and the onset of soreness may not occur for a few hours. Typically this soreness does not last longer than two days.  o Bruising is uncommon, however; ice can be used to decrease any possible bruising.  o In rare cases feeling tired or nauseous after the treatment is normal. In addition, your symptoms may get worse before they get better, this period will typically not last longer than 24 hours.   . What Can I do After My Treatment? o Increase your hydration by drinking more water for the next 24 hours. o You may place ice or heat on the areas treated that have become sore, however, do not use heat on inflamed or bruised areas. Heat often brings more relief post needling. o You can continue your regular activities, but vigorous activity is not recommended initially after the treatment for 24 hours. o DN is best combined with other physical therapy such as strengthening, stretching, and other therapies.    Trunk: Prone Extension (Press-Ups)    Lie on stomach on firm, flat surface.  Relax bottom and legs. Raise chest in air with elbows straight. Keep hips flat on surface, sag stomach. Hold ___2-3_ seconds. Repeat _50-10___ times. Do _1-2___ sessions per day. CAUTION: Movement should be gentle and slow.   Cat / Cow Flow    Inhale, press spine toward ceiling like a Halloween cat. Keeping strength in arms and abdominals, exhale to soften spine through neutral and into cow pose. Open chest and arch back. Initiate movement between cat and cow at tailbone, one vertebrae at a time. Repeat __5-10__ times.   Prone Plank (Eccentric)  Can do plank at wall or on counter     On toes and elbows, pull abdomen in while stabilizing trunk. Slowly lower downward without arching back. 30 sec _3-5 __ reps per set  Sitting -  Can add pushing weight forward while in sitting  Keep core tight   Back Wall Slide    With feet _10-12___ inches from wall, lean as much of back against the wall as possible. Gently squat down ___ inches, keeping core tight. Hold ___10_ seconds while counting out loud. Repeat _10___ times. Do ___1-2_ sessions per day.

## 2019-08-16 NOTE — Therapy (Addendum)
[place], [place], [address] Phone: [phone]   Fax:  [phone]  Physical Therapy Treatment  Patient Details  Name: Kathy Blankenship MRN: [id number] Date of Birth: Mar 11, 1971 Referring Provider (PT): Aundria Mems, MD   Encounter Date: 08/16/2019  PT End of Session - 08/16/19 1514    Visit Number  3    Number of Visits  12    Date for PT Re-Evaluation  09/16/19    PT Start Time  5056    PT Stop Time  9794    PT Time Calculation (min)  51 min    Activity Tolerance  Patient tolerated treatment well       Past Medical History:  Diagnosis Date  . Fibroids   . HSV-2 (herpes simplex virus 2) infection   . Left ovarian cyst     Past Surgical History:  Procedure Laterality Date  . COLONOSCOPY  2006  . MOLE REMOVAL  1995    There were no vitals filed for this visit.  Subjective Assessment - 08/16/19 1514    Subjective  NO significant change in symptoms. Working on exercises at home. Not as much pain into the anterior thighs    Currently in Pain?  No/denies    Pain Score  --   2/10 earlier today   Pain Location  Back    Pain Orientation  Lower    Pain Descriptors / Indicators  Discomfort    Pain Type  Acute pain    Pain Onset  More than a month ago    Pain Frequency  Intermittent                       [facility] Adult PT Treatment/Exercise - 08/16/19 0001      Exercises   Exercises  --   discussed plank on counter and wall as an option for forearm     Lumbar Exercises: Stretches   Press Ups  5 reps   2-3 sec      Lumbar Exercises: Aerobic   Nustep  L4 to 5: 24mn for warm up    PT present to discuss progress.      Lumbar Exercises: Standing   Wall Slides  10 reps   10 sec hold      Lumbar Exercises: Seated   Sit to Stand  10 reps   cues for neutral spine, mirror for feedback   Other Seated Lumbar Exercises  seated shoulder press forward with 5 # weight core  engaged       Lumbar Exercises: Quadruped   Madcat/Old Horse  5 reps    Plank  on forearms 30 sec x 3 reps       Manual Therapy   Manual therapy comments  pt prone     Soft tissue mobilization  deep tissue work through the lumbar paraspinals and QL - minimal muscular tightness noted              PT Education - 08/16/19 1534    Education Details  HEP    Person(s) Educated  Patient    Methods  Explanation;Demonstration;Tactile cues;Verbal cues;Handout    Comprehension  Verbalized understanding;Returned demonstration;Verbal cues required;Tactile cues required          PT Long Term Goals - 08/05/19 1737      PT LONG TERM GOAL #1   Title  The patient will be indep with HEP for core stabilization, flexibility and LE strengthening.  Time  6    Period  Weeks    Target Date  09/16/19      PT LONG TERM GOAL #2   Title  The patient will reduce functional limitation per FOTO score improving from 24% limitation to < or equal to 18% limitation.    Time  6    Period  Weeks    Target Date  09/16/19      PT LONG TERM GOAL #3   Title  The patient will improve LE strength to 5/5 for R hip flexion and abduction.    Time  6    Period  Weeks    Target Date  09/16/19      PT LONG TERM GOAL #4   Title  The patient will report reduced incidence of leg pain by 50%.    Time  6    Period  Weeks    Target Date  09/16/19      PT LONG TERM GOAL #5   Title  The patient will demonstrate proper body mechanics for lifting 10 lbs in order to perform safe lifting when needed at work.    Time  6    Period  Weeks    Target Date  09/16/19            Plan - 08/16/19 1557    Clinical Impression Statement  No pain entering or leaving the clinic. Minimal tightness noted in the lumbar paraspinals and QL area - not currently a canditate for DN - may re-visit in the future if muscular tightness persists. Added core stabilization and strengthening in functional positions without difficulty.  Will benefit from education in lifting techniques with practice engaging core with lifting.    Rehab Potential  Good    PT Frequency  2x / week    PT Duration  6 weeks    PT Treatment/Interventions  ADLs/Self Care Home Management;Gait training;Stair training;Functional mobility training;Therapeutic activities;Therapeutic exercise;Balance training;Neuromuscular re-education;Manual techniques;Dry needling;Taping;Traction;Electrical Stimulation;Patient/family education    PT Next Visit Plan  Progress HEP to include lifting and core stabilizaton, standing and sit to stand - functional activities. assess L hip joint mobility    Consulted and Agree with Plan of Care  Patient       Patient will benefit from skilled therapeutic intervention in order to improve the following deficits and impairments:     Visit Diagnosis: Acute bilateral low back pain with sciatica, sciatica laterality unspecified  Muscle weakness (generalized)     Problem List Patient Active Problem List   Diagnosis Date Noted  . Polyarthralgia 06/16/2019  . Spondylolisthesis at L5-S1 level 06/16/2019  . Urticaria 03/03/2017  . Muscle cramp 08/26/2016  . Onychomycosis 08/26/2016  . Other fatigue 08/26/2016  . Irregular menstrual cycle 03/26/2016  . Vitamin D deficiency 01/26/2014  . Numbness and tingling in left upper extremity 01/14/2014  . Hyperlipidemia 12/20/2013  . TIA (transient ischemic attack) 12/20/2013  . Seasonal allergies 12/20/2013  . SINUSITIS, CHRONIC 04/27/2008  . ALLERGIC RHINITIS 04/19/2008  . RHINITIS 03/08/2008  . DYSPNEA 03/08/2008    Kathy Blankenship Nilda Simmer PT, MPH  08/16/2019, 4:03 PM  [facility] [place], [place], [address] Phone: [phone]   Fax:  [phone]  Name: Kathy Blankenship MRN: [id number] Date of Birth: 05/07/1971

## 2019-08-19 ENCOUNTER — Encounter: Payer: Self-pay | Admitting: Physical Therapy

## 2019-08-19 ENCOUNTER — Other Ambulatory Visit: Payer: Self-pay

## 2019-08-19 ENCOUNTER — Ambulatory Visit (INDEPENDENT_AMBULATORY_CARE_PROVIDER_SITE_OTHER): Payer: BC Managed Care – PPO | Admitting: Physical Therapy

## 2019-08-19 DIAGNOSIS — M5440 Lumbago with sciatica, unspecified side: Secondary | ICD-10-CM

## 2019-08-19 DIAGNOSIS — M6281 Muscle weakness (generalized): Secondary | ICD-10-CM | POA: Diagnosis not present

## 2019-08-19 NOTE — Therapy (Signed)
[facility], [place], [address] Phone: [phone]   Fax:  [phone]  Physical Therapy Treatment  Patient Details  Name: Kathy Blankenship MRN: [id number] Date of Birth: 01-Jan-1971 Referring Provider (PT): Aundria Mems, MD   Encounter Date: 08/19/2019  PT End of Session - 08/19/19 1658    Visit Number  4    Number of Visits  12    Date for PT Re-Evaluation  09/16/19    PT Start Time  7782    PT Stop Time  1737    PT Time Calculation (min)  39 min    Activity Tolerance  Patient tolerated treatment well;No increased pain    Behavior During Therapy  WFL for tasks assessed/performed       Past Medical History:  Diagnosis Date  . Fibroids   . HSV-2 (herpes simplex virus 2) infection   . Left ovarian cyst     Past Surgical History:  Procedure Laterality Date  . COLONOSCOPY  2006  . MOLE REMOVAL  1995    There were no vitals filed for this visit.  Subjective Assessment - 08/19/19 1701    Subjective  Pt reports she has incorporated new exercises into her routine.  LE pain has decreased frequency by 50%.  She has been working on [employer].    Currently in Pain?  No/denies    Pain Score  0-No pain         [facility] PT Assessment - 08/19/19 0001      Assessment   Medical Diagnosis  spodylolisthesis at L5-S1 level    Referring Provider (PT)  Aundria Mems, MD    Onset Date/Surgical Date  --   approximately 2 months ago   Hand Dominance  Right      Strength   Right/Left Hip  Right    Right Hip Flexion  --   5-/5   Right Hip Extension  5/5    Right Hip ABduction  4+/5    Left Hip Flexion  4/5    Left Hip Extension  3+/5    Left Hip ABduction  4/5    Right Knee Flexion  4/5    Left Knee Flexion  --   5-/5     [facility] Adult PT Treatment/Exercise - 08/19/19 0001      Lumbar Exercises: Stretches   Double Knee to Chest Stretch  2 reps;20 seconds    Other Lumbar Stretch  Exercise  childs pose x 20 sec x 2, child's pose with lateral trunk stretch x 15 sec each side- repeated in sitting (version for work);  and then lat stretch in standing with arms on table, walking legs back.       Lumbar Exercises: Aerobic   Nustep  L5: 5 min, arms/legs      Lumbar Exercises: Standing   Lifting  From floor;10 reps   floor to waist, while in high kneeling/ single leg kneel.    Lifting Weights (lbs)  11    Lifting Limitations  (to represent coin boxes at work)    [employer]  --   verbally reviewed.    Other Standing Lumbar Exercises  split squats x  10 reps each leg (knee not quite to ground)       Lumbar Exercises: Supine   Straight Leg Raise  10 reps   LLE     Lumbar Exercises: Sidelying   Hip Abduction  Right;Left;10 reps   with TA engaged for stabilization  of pelvis.    Hip Abduction Weights (lbs)  5 pulses per rep.       Lumbar Exercises: Prone   Other Prone Lumbar Exercises  3 reps of plank on forearms x 10 sec (one rep trial with knee taps- not tolerated)       Lumbar Exercises: Quadruped   Madcat/Old Horse  5 reps    Madcat/Old Horse Limitations  and wag the tail - side bending x 5 reps                  PT Long Term Goals - 08/19/19 1725      PT LONG TERM GOAL #1   Title  The patient will be indep with HEP for core stabilization, flexibility and LE strengthening.    Time  6    Period  Weeks    Status  On-going      PT LONG TERM GOAL #2   Title  The patient will reduce functional limitation per FOTO score improving from 24% limitation to < or equal to 18% limitation.    Time  6    Period  Weeks    Status  On-going      PT LONG TERM GOAL #3   Title  The patient will improve LE strength to 5/5 for R hip flexion and abduction.    Time  6    Period  Weeks    Status  On-going      PT LONG TERM GOAL #4   Title  The patient will report reduced incidence of leg pain by 50%.    Time  6    Period  Weeks    Status  Achieved      PT  LONG TERM GOAL #5   Title  The patient will demonstrate proper body mechanics for lifting 10 lbs in order to perform safe lifting when needed at work.    Time  6    Period  Weeks    Status  Achieved            Plan - 08/19/19 1723    Clinical Impression Statement  Pt has met LTG #4 with 50% reduction in frequency of LE symptoms, and LTG#5 by demonstrated good body mechanics with 10# lift from ground to waist.  Pt tolerated all exercises, noting fatigue in LEs but no pain in back. Pt making good gains towards goals each visit.    Rehab Potential  Good    PT Frequency  2x / week    PT Duration  6 weeks    PT Treatment/Interventions  ADLs/Self Care Home Management;Gait training;Stair training;Functional mobility training;Therapeutic activities;Therapeutic exercise;Balance training;Neuromuscular re-education;Manual techniques;Dry needling;Taping;Traction;Electrical Stimulation;Patient/family education    PT Next Visit Plan  continue core and spinal stabilization. add Lt hip ext exercise.    PT Home Exercise Plan  [email or web address]    Consulted and Agree with Plan of Care  Patient       Patient will benefit from skilled therapeutic intervention in order to improve the following deficits and impairments:     Visit Diagnosis: Acute bilateral low back pain with sciatica, sciatica laterality unspecified  Muscle weakness (generalized)     Problem List Patient Active Problem List   Diagnosis Date Noted  . Polyarthralgia 06/16/2019  . Spondylolisthesis at L5-S1 level 06/16/2019  . Urticaria 03/03/2017  . Muscle cramp 08/26/2016  . Onychomycosis 08/26/2016  . Other fatigue 08/26/2016  . Irregular menstrual cycle 03/26/2016  . Vitamin D deficiency 01/26/2014  .  Numbness and tingling in left upper extremity 01/14/2014  . Hyperlipidemia 12/20/2013  . TIA (transient ischemic attack) 12/20/2013  . Seasonal allergies 12/20/2013  . SINUSITIS, CHRONIC 04/27/2008  . ALLERGIC RHINITIS 04/19/2008   . RHINITIS 03/08/2008  . DYSPNEA 03/08/2008   Kerin Perna, PTA 08/19/19 5:49 PM  [facility], [place], [address] Phone: [phone]   Fax:  [phone]  Name: Kathy Blankenship MRN: [id number] Date of Birth: 15-Nov-1970

## 2019-08-25 ENCOUNTER — Other Ambulatory Visit: Payer: Self-pay

## 2019-08-25 ENCOUNTER — Ambulatory Visit (INDEPENDENT_AMBULATORY_CARE_PROVIDER_SITE_OTHER): Payer: BC Managed Care – PPO | Admitting: Sports Medicine

## 2019-08-25 ENCOUNTER — Encounter: Payer: Self-pay | Admitting: Sports Medicine

## 2019-08-25 DIAGNOSIS — M4317 Spondylolisthesis, lumbosacral region: Secondary | ICD-10-CM

## 2019-08-25 NOTE — Assessment & Plan Note (Signed)
This pleasant 49 year old female returns, she has grade 2 L5-S1 spondylolisthesis, she did have bilateral radicular symptoms. At the last visit she was doing a little bit better but was not really taking her meloxicam or being aggressive with therapy. We proceeded with aggressive formal physical therapy with core stabilization, as well as more regular dosing of meloxicam, at this point her symptoms have resolved, she is no longer needing meloxicam, she will probably do another session of formal PT and then transition to a home exercise program. Return to see me as needed.

## 2019-08-25 NOTE — Progress Notes (Signed)
    Procedures performed today:    None.  Independent interpretation of notes and tests performed by another provider:   None.  Brief History, Exam, Impression, and Recommendations:    Spondylolisthesis at L5-S1 level This pleasant 49 year old female returns, she has grade 2 L5-S1 spondylolisthesis, she did have bilateral radicular symptoms. At the last visit she was doing a little bit better but was not really taking her meloxicam or being aggressive with therapy. We proceeded with aggressive formal physical therapy with core stabilization, as well as more regular dosing of meloxicam, at this point her symptoms have resolved, she is no longer needing meloxicam, she will probably do another session of formal PT and then transition to a home exercise program. Return to see me as needed.    ___________________________________________ Gwen Her. Dianah Field, M.D., ABFM., CAQSM. Primary Care and [place] [employer] [place] [facility]

## 2019-08-26 ENCOUNTER — Ambulatory Visit (INDEPENDENT_AMBULATORY_CARE_PROVIDER_SITE_OTHER): Payer: BC Managed Care – PPO | Admitting: Rehabilitative and Restorative Service Providers"

## 2019-08-26 ENCOUNTER — Encounter: Payer: Self-pay | Admitting: Rehabilitative and Restorative Service Providers"

## 2019-08-26 DIAGNOSIS — M5440 Lumbago with sciatica, unspecified side: Secondary | ICD-10-CM | POA: Diagnosis not present

## 2019-08-26 DIAGNOSIS — M6281 Muscle weakness (generalized): Secondary | ICD-10-CM

## 2019-08-26 NOTE — Therapy (Addendum)
[place], [place], [address] Phone: [phone]   Fax:  [phone]  Physical Therapy Treatment  Patient Details  Name: Kathy Blankenship MRN: [id number] Date of Birth: [birth year] Referring Provider (PT): Aundria Mems, MD   Encounter Date: 08/26/2019  PT End of Session - 08/26/19 1652    Visit Number  5    Number of Visits  12    Date for PT Re-Evaluation  09/16/19    PT Start Time  8833    PT Stop Time  1733    PT Time Calculation (min)  40 min    Activity Tolerance  Patient tolerated treatment well       Past Medical History:  Diagnosis Date  . Fibroids   . HSV-2 (herpes simplex virus 2) infection   . Left ovarian cyst     Past Surgical History:  Procedure Laterality Date  . COLONOSCOPY  2006  . MOLE REMOVAL  1995    There were no vitals filed for this visit.  Subjective Assessment - 08/26/19 1653    Subjective  Pain has subsided. She has a "little twinge" in the Rt LB area - may have slept wrong. Making time at home to do her exercises. She will continue with HEP on her own and call with any questions or problems.    Currently in Pain?  No/denies         [facility] PT Assessment - 08/26/19 0001      Assessment   Medical Diagnosis  spodylolisthesis at L5-S1 level    Referring Provider (PT)  Aundria Mems, MD    Onset Date/Surgical Date  --   approximately 2 months ago   Hand Dominance  Right      Observation/Other Assessments   Focus on Therapeutic Outcomes (FOTO)   21% limitation       Strength   Overall Strength Comments  strength WFL's bilat LE's       Flexibility   Hamstrings  WFL's bilat       Palpation   Palpation comment  no significant tenderness and tightness bilateral erector spinae (> muscle tightness on the left side)                   [facility] Adult PT Treatment/Exercise - 08/26/19 0001      Lumbar Exercises: Stretches   Other Lumbar  Stretch Exercise  3 way doorway stretch 30 sec x 3 reps each position     Other Lumbar Stretch Exercise  lat stretch supine 30 sec x 2 reps PT assist for LE placement; prayer stretch 30 sec x 3 reps       Lumbar Exercises: Standing   Wall Slides  10 reps   10 sec hold    Scapular Retraction  Strengthening;Both;10 reps;Theraband    Theraband Level (Scapular Retraction)  Level 2 (Red)    Row  Strengthening;Both;10 reps    Theraband Level (Row)  Level 2 (Red)    Row Limitations  bow and arrow 10 reps each side red TB     Shoulder Extension  Strengthening;Both;10 reps    Theraband Level (Shoulder Extension)  Level 2 (Red)    Other Standing Lumbar Exercises  scap squeeze with noodle 10 sec x 10 reps core engaged       Lumbar Exercises: Seated   Sit to Stand  10 reps      Lumbar Exercises: Quadruped   Madcat/Old Horse  5 reps  added TB for resistance to cat x 5    Madcat/Old Horse Limitations  sit back to child's pose x 5              PT Education - 08/26/19 1735    Education Details  HEP    Person(s) Educated  Patient    Methods  Explanation;Demonstration;Tactile cues;Verbal cues;Handout    Comprehension  Verbalized understanding;Returned demonstration;Verbal cues required;Tactile cues required          PT Long Term Goals - 08/19/19 1725      PT LONG TERM GOAL #1   Title  The patient will be indep with HEP for core stabilization, flexibility and LE strengthening.    Time  6    Period  Weeks    Status  On-going      PT LONG TERM GOAL #2   Title  The patient will reduce functional limitation per FOTO score improving from 24% limitation to < or equal to 18% limitation.    Time  6    Period  Weeks    Status  On-going      PT LONG TERM GOAL #3   Title  The patient will improve LE strength to 5/5 for R hip flexion and abduction.    Time  6    Period  Weeks    Status  On-going      PT LONG TERM GOAL #4   Title  The patient will report reduced incidence of leg pain  by 50%.    Time  6    Period  Weeks    Status  Achieved      PT LONG TERM GOAL #5   Title  The patient will demonstrate proper body mechanics for lifting 10 lbs in order to perform safe lifting when needed at work.    Time  6    Period  Weeks    Status  Achieved            Plan - 08/26/19 1723    Clinical Impression Statement  Patient pleased with progress and confident in continuing with independent HEP. She added core stretgthening and lat stretches today without difficulty. Will call with any questioins or problems.    Rehab Potential  Good    PT Frequency  2x / week    PT Duration  6 weeks    PT Treatment/Interventions  ADLs/Self Care Home Management;Gait training;Stair training;Functional mobility training;Therapeutic activities;Therapeutic exercise;Balance training;Neuromuscular re-education;Manual techniques;Dry needling;Taping;Traction;Electrical Stimulation;Patient/family education    PT Next Visit Plan  pt will continue with HEP and call with any questions or problems    PT [place] and Agree with Plan of Care  Patient       Patient will benefit from skilled therapeutic intervention in order to improve the following deficits and impairments:     Visit Diagnosis: Acute bilateral low back pain with sciatica, sciatica laterality unspecified  Muscle weakness (generalized)     Problem List Patient Active Problem List   Diagnosis Date Noted  . Polyarthralgia 06/16/2019  . Spondylolisthesis at L5-S1 level 06/16/2019  . Urticaria 03/03/2017  . Muscle cramp 08/26/2016  . Onychomycosis 08/26/2016  . Other fatigue 08/26/2016  . Irregular menstrual cycle 03/26/2016  . Vitamin D deficiency 01/26/2014  . Numbness and tingling in left upper extremity 01/14/2014  . Hyperlipidemia 12/20/2013  . TIA (transient ischemic attack) 12/20/2013  . Seasonal allergies 12/20/2013  . SINUSITIS, CHRONIC 04/27/2008  . ALLERGIC  RHINITIS 04/19/2008   . RHINITIS 03/08/2008  . DYSPNEA 03/08/2008    Rapheal Masso Nilda Simmer PT, MPH  08/26/2019, 5:46 PM  [facility] [place], [place], [address] Phone: [phone]   Fax:  [phone]  Name: Kathy Blankenship MRN: [id number] Date of Birth: 06-25-1970  PHYSICAL THERAPY DISCHARGE SUMMARY  Visits from Start of Care: 5  Current functional level related to goals / functional outcomes: See last progress note for discharge status    Remaining deficits: Unknown    Education / Equipment: HEP  Plan: Patient agrees to discharge.  Patient goals were met. Patient is being discharged due to meeting the stated rehab goals.  ?????    Annalese Stiner P. Helene Kelp PT, MPH 09/17/19 8:45 AM

## 2019-08-26 NOTE — Patient Instructions (Addendum)
Access Code: [email or web address]: [email or web address]: 04/01/2021Prepared by: Iasiah Ozment HoltExercises  Child's Pose Stretch - 2 x daily - 7 x weekly - 2 reps - 1 sets - 60 seconds hold  Child's Pose with Sidebending - 2 x daily - 7 x weekly - 2 reps - 1 sets - 30-60 seconds hold  Marching Bridge - 2 x daily - 7 x weekly - 10 reps - 1 sets  Sidelying Hip Abduction - 2 x daily - 7 x weekly - 10 reps - 1 sets  Hooklying Transversus Abdominis Palpation - 2 x daily - 7 x weekly - 1 sets - 10 reps - 5 hold  Seated Scapular Retraction - 2 x daily - 7 x weekly - 2 sets - 10 reps - 10sec hold  Shoulder External Rotation and Scapular Retraction with Resistance - 2 x daily - 7 x weekly - 2 sets - 10 reps - 2sec hold  Standing Row with Anchored Resistance - 2 x daily - 7 x weekly - 2 sets - 10 reps - 2 sec hold  Shoulder Extension with Resistance - 2 x daily - 7 x weekly - 2 sets - 10 reps - 2 sec hold  Doorway Pec Stretch at 60 Degrees Abduction - 3 x daily - 7 x weekly - 3 reps - 1 sets  Doorway Pec Stretch at 90 Degrees Abduction - 3 x daily - 7 x weekly - 3 reps - 1 sets - 30 seconds hold  Doorway Pec Stretch at 120 Degrees Abduction - 3 x daily - 7 x weekly - 3 reps - 1 sets - 30 second hold hold Lat stretch supine and prayer stretch

## 2019-10-22 ENCOUNTER — Encounter: Payer: Self-pay | Admitting: Osteopathic Medicine

## 2019-10-22 ENCOUNTER — Ambulatory Visit (INDEPENDENT_AMBULATORY_CARE_PROVIDER_SITE_OTHER): Payer: BC Managed Care – PPO | Admitting: Osteopathic Medicine

## 2019-10-22 VITALS — BP 108/71 | HR 74 | Temp 98.0°F | Wt 151.0 lb

## 2019-10-22 DIAGNOSIS — Z1211 Encounter for screening for malignant neoplasm of colon: Secondary | ICD-10-CM | POA: Diagnosis not present

## 2019-10-22 DIAGNOSIS — Z0000 Encounter for general adult medical examination without abnormal findings: Secondary | ICD-10-CM

## 2019-10-22 DIAGNOSIS — G2581 Restless legs syndrome: Secondary | ICD-10-CM

## 2019-10-22 DIAGNOSIS — Z1231 Encounter for screening mammogram for malignant neoplasm of breast: Secondary | ICD-10-CM | POA: Diagnosis not present

## 2019-10-22 NOTE — Progress Notes (Signed)
HPI: Kathy Blankenship is a 49 y.o. female who  has a past medical history of Fibroids, HSV-2 (herpes simplex virus 2) infection, and Left ovarian cyst.  she presents to [facility] today, 10/22/19,  for chief complaint of: Annual physical     Patient here for annual physical / wellness exam.  See preventive care reviewed as below.  Recent labs reviewed in detail with the patient.   Additional concerns today include:  Legs tingling and cramping still bothering her from last year, was intermittent but now seems more frequent and severe      Past medical, surgical, social and family history reviewed:  Patient Active Problem List   Diagnosis Date Noted  . Polyarthralgia 06/16/2019  . Spondylolisthesis at L5-S1 level 06/16/2019  . Urticaria 03/03/2017  . Muscle cramp 08/26/2016  . Onychomycosis 08/26/2016  . Other fatigue 08/26/2016  . Irregular menstrual cycle 03/26/2016  . Vitamin D deficiency 01/26/2014  . Numbness and tingling in left upper extremity 01/14/2014  . Hyperlipidemia 12/20/2013  . TIA (transient ischemic attack) 12/20/2013  . Seasonal allergies 12/20/2013  . SINUSITIS, CHRONIC 04/27/2008  . ALLERGIC RHINITIS 04/19/2008  . RHINITIS 03/08/2008  . DYSPNEA 03/08/2008    Past Surgical History:  Procedure Laterality Date  . COLONOSCOPY  2006  . MOLE REMOVAL  1995    Social History   Tobacco Use  . Smoking status: Never Smoker  . Smokeless tobacco: Never Used  Substance Use Topics  . Alcohol use: No    Family History  Problem Relation Age of Onset  . Rheum arthritis Father   . Heart attack Father   . Hypertension Mother      Current medication list and allergy/intolerance information reviewed:    Current Outpatient Medications  Medication Sig Dispense Refill  . meloxicam (MOBIC) 15 MG tablet One tab PO qAM with a meal for 2 weeks, then daily prn pain. 30 tablet 3   No current facility-administered medications for  this visit.    Allergies  Allergen Reactions  . Lamisil [Terbinafine] Rash  . Terbinafine Rash     Exam:  BP 108/71 (BP Location: Right Arm, Patient Position: Sitting)   Pulse 74   Temp 98 F (36.7 C)   Wt 151 lb (68.5 kg)   SpO2 98%   BMI 25.13 kg/m   Constitutional: VS see above. General Appearance: alert, well-developed, well-nourished, NAD  Eyes: Normal lids and conjunctive, non-icteric sclera  Ears, Nose, Mouth, Throat:  TM normal bilaterally.   Neck: No masses, trachea midline. No thyroid enlargement. No tenderness/mass appreciated. No lymphadenopathy  Respiratory: Normal respiratory effort. no wheeze, no rhonchi, no rales  Cardiovascular: S1/S2 normal, no murmur, no rub/gallop auscultated. RRR. No lower extremity edema.   Gastrointestinal: Nontender, no masses. No hepatomegaly, no splenomegaly. No hernia appreciated. Bowel sounds normal. Rectal exam deferred.   Musculoskeletal: Gait normal. No clubbing/cyanosis of digits.   Neurological: Normal balance/coordination. No tremor. No cranial nerve deficit on limited exam. Motor and sensation intact and symmetric. Cerebellar reflexes intact.   Skin: warm, dry, intact. No rash/ulcer. No concerning nevi or subq nodules on limited exam.    Psychiatric: Normal judgment/insight. Normal mood and affect. Oriented x3.       ASSESSMENT/PLAN: The primary encounter diagnosis was Annual physical exam. Diagnoses of Breast cancer screening by mammogram, Colon cancer screening, and Restless legs syndrome (RLS) were also pertinent to this visit.   Orders Placed This Encounter  Procedures  . MM 3D SCREEN BREAST  BILATERAL  . CBC  . Lipid panel  . COMPLETE METABOLIC PANEL WITH GFR  . Vitamin B12  . Urinalysis, Routine w reflex microscopic  . Fe+TIBC+Fer    No orders of the defined types were placed in this encounter.     Patient Instructions  General Preventive Care  Most recent routine screening labs: ordered today.    Everyone should have blood pressure checked once per year. Goal 130/80 or less.   Tobacco: don't!   Alcohol: responsible moderation is ok for most adults - if you have concerns about your alcohol intake, please talk to me!   Exercise: as tolerated to reduce risk of cardiovascular disease and diabetes. Strength training will also prevent osteoporosis.   Mental health: if need for mental health care (medicines, counseling, other), or concerns about moods, please let me know!   Sexual health: if need for STD testing, or if concerns with libido/pain problems, please let me know!   Advanced Directive: Living Will and/or Healthcare Power of Attorney recommended for all adults, regardless of age or health.  Vaccines  Flu vaccine: for almost everyone, every fall.   Shingles vaccine: after age 35.   Pneumonia vaccines: after age 25  Tetanus booster: every 10 years - due 11/2023  COVID vaccine: THANKS for getting your vaccine!  Cancer screenings   Colon cancer screening: for everyone age 59-75.   Breast cancer screening: mammogram at age 26 every other year at least, and annually after age 37. Next year is ok   Cervical cancer screening: Pap due 09/2022  Lung cancer screening: not needed for non-smokers  Infection screenings  . HIV: recommended screening at least once age 47-65, more often as needed. . Gonorrhea/Chlamydia: screening as needed . Hepatitis C: recommended once for everyone age 72-75 . TB: certain at-risk populations, or depending on work requirements and/or travel history Other . Bone Density Test: recommended at age 44       Visit summary with medication list and pertinent instructions was printed for patient to review. All questions at time of visit were answered - patient instructed to contact office with any additional concerns or updates. ER/RTC precautions were reviewed with the patient.     Please note: voice recognition software was used to produce  this document, and typos may escape review. Please contact Dr. Sheppard Coil for any needed clarifications.     Follow-up plan: Return in about 1 year (around 10/21/2020) for [place] (call week prior to visit for lab orders).

## 2019-10-22 NOTE — Patient Instructions (Addendum)
General Preventive Care  Most recent routine screening labs: ordered today.   Everyone should have blood pressure checked once per year. Goal 130/80 or less.   Tobacco: don't!   Alcohol: responsible moderation is ok for most adults - if you have concerns about your alcohol intake, please talk to me!   Exercise: as tolerated to reduce risk of cardiovascular disease and diabetes. Strength training will also prevent osteoporosis.   Mental health: if need for mental health care (medicines, counseling, other), or concerns about moods, please let me know!   Sexual health: if need for STD testing, or if concerns with libido/pain problems, please let me know!   Advanced Directive: Living Will and/or Healthcare Power of Attorney recommended for all adults, regardless of age or health.  Vaccines  Flu vaccine: for almost everyone, every fall.   Shingles vaccine: after age 62.   Pneumonia vaccines: after age 60  Tetanus booster: every 10 years - due 11/2023  COVID vaccine: THANKS for getting your vaccine!  Cancer screenings   Colon cancer screening: for everyone age 41-75.   Breast cancer screening: mammogram at age 1 every other year at least, and annually after age 41. Next year is ok   Cervical cancer screening: Pap due 09/2022  Lung cancer screening: not needed for non-smokers  Infection screenings  . HIV: recommended screening at least once age 62-65, more often as needed. . Gonorrhea/Chlamydia: screening as needed . Hepatitis C: recommended once for everyone age 51-75 . TB: certain at-risk populations, or depending on work requirements and/or travel history Other . Bone Density Test: recommended at age 98

## 2019-10-23 LAB — URINALYSIS, ROUTINE W REFLEX MICROSCOPIC
Bacteria, UA: NONE SEEN /HPF
Bilirubin Urine: NEGATIVE
Glucose, UA: NEGATIVE
Hyaline Cast: NONE SEEN /LPF
Ketones, ur: NEGATIVE
Leukocytes,Ua: NEGATIVE
Nitrite: NEGATIVE
Protein, ur: NEGATIVE
Specific Gravity, Urine: 1.017 (ref 1.001–1.03)
Squamous Epithelial / HPF: NONE SEEN /HPF (ref <=5)
WBC, UA: NONE SEEN /HPF (ref 0–5)
pH: 6.5 (ref 5.0–8.0)

## 2019-10-23 LAB — COMPLETE METABOLIC PANEL WITH GFR
AG Ratio: 1.6 (calc) (ref 1.0–2.5)
ALT: 14 U/L (ref 6–29)
AST: 17 U/L (ref 10–35)
Albumin: 4.4 g/dL (ref 3.6–5.1)
Alkaline phosphatase (APISO): 64 U/L (ref 31–125)
BUN: 10 mg/dL (ref 7–25)
CO2: 26 mmol/L (ref 20–32)
Calcium: 9.5 mg/dL (ref 8.6–10.2)
Chloride: 108 mmol/L (ref 98–110)
Creat: 0.81 mg/dL (ref 0.50–1.10)
GFR, Est African American: 101 mL/min/{1.73_m2} (ref >=60)
GFR, Est Non African American: 87 mL/min/{1.73_m2} (ref >=60)
Globulin: 2.8 g/dL (calc) (ref 1.9–3.7)
Glucose, Bld: 88 mg/dL (ref 65–99)
Potassium: 4 mmol/L (ref 3.5–5.3)
Sodium: 143 mmol/L (ref 135–146)
Total Bilirubin: 0.7 mg/dL (ref 0.2–1.2)
Total Protein: 7.2 g/dL (ref 6.1–8.1)

## 2019-10-23 LAB — IRON,TIBC AND FERRITIN PANEL
%SAT: 30 % (calc) (ref 16–45)
Ferritin: 29 ng/mL (ref 16–232)
Iron: 96 ug/dL (ref 40–190)
TIBC: 319 mcg/dL (calc) (ref 250–450)

## 2019-10-23 LAB — LIPID PANEL
Cholesterol: 169 mg/dL (ref <200)
HDL: 54 mg/dL (ref >=50)
LDL Cholesterol (Calc): 101 mg/dL (calc) — ABNORMAL HIGH
Non-HDL Cholesterol (Calc): 115 mg/dL (calc) (ref <130)
Total CHOL/HDL Ratio: 3.1 (calc) (ref <5.0)
Triglycerides: 56 mg/dL (ref <150)

## 2019-10-23 LAB — CBC
HCT: 40.6 % (ref 35.0–45.0)
Hemoglobin: 13.5 g/dL (ref 11.7–15.5)
MCH: 27.9 pg (ref 27.0–33.0)
MCHC: 33.3 g/dL (ref 32.0–36.0)
MCV: 83.9 fL (ref 80.0–100.0)
MPV: 10.9 fL (ref 7.5–12.5)
Platelets: 197 10*3/uL (ref 140–400)
RBC: 4.84 10*6/uL (ref 3.80–5.10)
RDW: 13.3 % (ref 11.0–15.0)
WBC: 4 10*3/uL (ref 3.8–10.8)

## 2019-10-23 LAB — VITAMIN B12: Vitamin B-12: 363 pg/mL (ref 200–1100)

## 2019-11-09 LAB — COLOGUARD: Cologuard: NEGATIVE

## 2019-11-15 LAB — COLOGUARD
COLOGUARD: NEGATIVE
Cologuard: NEGATIVE

## 2019-11-22 ENCOUNTER — Encounter: Payer: Self-pay | Admitting: Osteopathic Medicine

## 2020-07-06 ENCOUNTER — Ambulatory Visit: Payer: BC Managed Care – PPO | Admitting: Osteopathic Medicine

## 2020-07-12 ENCOUNTER — Other Ambulatory Visit: Payer: Self-pay

## 2020-07-12 ENCOUNTER — Ambulatory Visit (INDEPENDENT_AMBULATORY_CARE_PROVIDER_SITE_OTHER): Payer: BC Managed Care – PPO | Admitting: Osteopathic Medicine

## 2020-07-12 ENCOUNTER — Encounter: Payer: Self-pay | Admitting: Osteopathic Medicine

## 2020-07-12 VITALS — BP 135/72 | HR 86 | Temp 98.9°F | Resp 20 | Ht 65.0 in | Wt 151.0 lb

## 2020-07-12 DIAGNOSIS — M542 Cervicalgia: Secondary | ICD-10-CM

## 2020-07-12 DIAGNOSIS — M791 Myalgia, unspecified site: Secondary | ICD-10-CM

## 2020-07-12 DIAGNOSIS — R200 Anesthesia of skin: Secondary | ICD-10-CM | POA: Diagnosis not present

## 2020-07-12 DIAGNOSIS — B351 Tinea unguium: Secondary | ICD-10-CM | POA: Diagnosis not present

## 2020-07-12 MED ORDER — CICLOPIROX 8 % EX SOLN
Freq: Every day | CUTANEOUS | 6 refills | Status: DC
Start: 2020-07-12 — End: 2021-09-17

## 2020-07-12 NOTE — Progress Notes (Signed)
Kathy Blankenship is a 50 y.o. female who presents to  [place] at [facility]  today, 07/12/20, seeking care for the following:  . Concern for intermittent leg/thigh discomfort, comes and goes, nothing really makes worse, helped by Aleve which she takes maybe once per week. She works at a bank, stands most of the day. She is worried about blood clots in the legs  . Concern for R leg numbness -  . 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 Reports sharp pains base of head, occasionally, seems to come and go, was not an issue for awhile now seems to be happening again similar to a few years ago - reviewed imaging, normal C-spine MRI 01/2014 . Significant onychomycosis L great toenail -     ASSESSMENT & PLAN with other pertinent findings:  The primary encounter diagnosis was Myalgia. Diagnoses of Right leg numbness, Cervical pain (neck), Onychomycosis, and Onychomycosis were also pertinent to this visit.   Normal physical exam see below Ddx includes osteoarthritis w/ likely fibromyalgia vs muscle strain d/t repetitive strain at work standing on hard surfaces.  No suspicion for DVT, polymyalgia.   Advised increased hydration and activity as these measured have helped int he past. OK to increase Aleve use if needed. Labs below to eval.   Also removed nail which was barely hanging on, refilled ciclopirox, very low threshold for podiatry referral   There are no Patient Instructions on file for this visit.  Orders Placed This Encounter  Procedures  . CBC with Differential/Platelet  . COMPLETE METABOLIC PANEL WITH GFR  . CK  . High sensitivity CRP  . Rheumatoid factor  . Sedimentation rate  . ANA  . Fe+TIBC+Fer  . Vitamin B12    Meds ordered this encounter  Medications  . ciclopirox (PENLAC) 8 % solution    Sig: Apply topically at bedtime. Apply over nail and surrounding skin.  Apply daily over previous coat. After seven (7) days, may remove with alcohol and continue cycle up to 48 weeks    Dispense:  6 mL    Refill:  6     See below for relevant physical exam findings  See below for recent lab and imaging results reviewed  Medications, allergies, PMH, PSH, SocH, FamH reviewed below    Follow-up instructions: Return for RECHECK PENDING RESULTS / IF WORSE OR CHANGE.                                        Exam:  BP 135/72 (BP Location: Right Arm, Cuff Size: Normal)   Pulse 86   Temp 98.9 F (37.2 C) (Oral)   Resp 20   Ht 5' 5"  (1.651 m)   Wt 151 lb (68.5 kg)   SpO2 97%   BMI 25.13 kg/m   Constitutional: VS see above. General Appearance: alert, well-developed, well-nourished, NAD  Neck: No masses, trachea midline.   Respiratory: Normal respiratory effort. no wheeze, no rhonchi, no rales  Cardiovascular: S1/S2 normal, no murmur, no rub/gallop auscultated. RRR.   Musculoskeletal: Gait normal. Symmetric and independent movement of all extremities. 5/5 strength in all extremities (hip flexion/extension, knee flexion/extension, ankle dorsiflexion, arm abduction and adduction, normal ROM hips and shoulders, no midline spinal tenderness, negative BL SLR)   Abdominal: non-tender, non-distended, no appreciable organomegaly, neg Murphy's, BS WNLx4  Neurological: Normal balance/coordination. No tremor.  Skin: warm, dry, intact.   Psychiatric: Normal judgment/insight. Normal mood and affect. Oriented x3.   No outpatient medications have been marked as taking for the 07/12/20 encounter (Office Visit) with Emeterio Reeve, DO.    Allergies  Allergen Reactions  . Lamisil [Terbinafine] Rash  . Terbinafine Rash    Patient Active Problem List   Diagnosis Date Noted  . Polyarthralgia 06/16/2019  . Spondylolisthesis at L5-S1 level 06/16/2019  . Urticaria 03/03/2017  . Muscle cramp 08/26/2016  . Onychomycosis  08/26/2016  . Other fatigue 08/26/2016  . Irregular menstrual cycle 03/26/2016  . Vitamin D deficiency 01/26/2014  . Numbness and tingling in left upper extremity 01/14/2014  . Hyperlipidemia 12/20/2013  . TIA (transient ischemic attack) 12/20/2013  . Seasonal allergies 12/20/2013  . SINUSITIS, CHRONIC 04/27/2008  . ALLERGIC RHINITIS 04/19/2008  . RHINITIS 03/08/2008  . DYSPNEA 03/08/2008    Family History  Problem Relation Age of Onset  . Rheum arthritis Father   . Heart attack Father   . Hypertension Mother     Social History   Tobacco Use  Smoking Status Never Smoker  Smokeless Tobacco Never Used    Past Surgical History:  Procedure Laterality Date  . COLONOSCOPY  2006  . MOLE REMOVAL  1995    Immunization History  Administered Date(s) Administered  . Influenza Whole 03/08/2008  . Influenza,inj,Quad PF,6+ Mos 04/23/2016, 03/03/2017, 03/14/2018, 02/06/2019  . Influenza,inj,quad, With Preservative 02/19/2014  . Influenza-Unspecified 02/19/2014, 02/25/2020  . Moderna Sars-Covid-2 Vaccination 08/19/2019, 09/16/2019, 03/27/2020  . Tdap 12/20/2013    No results found for this or any previous visit (from the past 2160 hour(s)).  No results found.     All questions at time of visit were answered - patient instructed to contact office with any additional concerns or updates. ER/RTC precautions were reviewed with the patient as applicable.   Please note: manual typing as well as voice recognition software may have been used to produce this document - typos may escape review. Please contact Dr. Sheppard Coil for any needed clarifications.

## 2020-07-13 LAB — IRON,TIBC AND FERRITIN PANEL
%SAT: 32 % (calc) (ref 16–45)
Ferritin: 25 ng/mL (ref 16–232)
Iron: 109 ug/dL (ref 40–190)
TIBC: 343 mcg/dL (calc) (ref 250–450)

## 2020-07-13 LAB — VITAMIN B12: Vitamin B-12: 321 pg/mL (ref 200–1100)

## 2020-07-14 LAB — COMPLETE METABOLIC PANEL WITH GFR
AG Ratio: 1.7 (calc) (ref 1.0–2.5)
ALT: 12 U/L (ref 6–29)
AST: 14 U/L (ref 10–35)
Albumin: 4.7 g/dL (ref 3.6–5.1)
Alkaline phosphatase (APISO): 66 U/L (ref 31–125)
BUN: 10 mg/dL (ref 7–25)
CO2: 30 mmol/L (ref 20–32)
Calcium: 10 mg/dL (ref 8.6–10.2)
Chloride: 107 mmol/L (ref 98–110)
Creat: 0.66 mg/dL (ref 0.50–1.10)
GFR, Est African American: 122 mL/min/{1.73_m2} (ref >=60)
GFR, Est Non African American: 105 mL/min/{1.73_m2} (ref >=60)
Globulin: 2.7 g/dL (calc) (ref 1.9–3.7)
Glucose, Bld: 75 mg/dL (ref 65–99)
Potassium: 4 mmol/L (ref 3.5–5.3)
Sodium: 145 mmol/L (ref 135–146)
Total Bilirubin: 1 mg/dL (ref 0.2–1.2)
Total Protein: 7.4 g/dL (ref 6.1–8.1)

## 2020-07-14 LAB — CBC WITH DIFFERENTIAL/PLATELET
Absolute Monocytes: 281 cells/uL (ref 200–950)
Basophils Absolute: 42 cells/uL (ref 0–200)
Basophils Relative: 0.8 %
Eosinophils Absolute: 88 cells/uL (ref 15–500)
Eosinophils Relative: 1.7 %
HCT: 39 % (ref 35.0–45.0)
Hemoglobin: 13.1 g/dL (ref 11.7–15.5)
Lymphs Abs: 2907 cells/uL (ref 850–3900)
MCH: 28.1 pg (ref 27.0–33.0)
MCHC: 33.6 g/dL (ref 32.0–36.0)
MCV: 83.7 fL (ref 80.0–100.0)
MPV: 11.1 fL (ref 7.5–12.5)
Monocytes Relative: 5.4 %
Neutro Abs: 1882 cells/uL (ref 1500–7800)
Neutrophils Relative %: 36.2 %
Platelets: 221 10*3/uL (ref 140–400)
RBC: 4.66 10*6/uL (ref 3.80–5.10)
RDW: 13.5 % (ref 11.0–15.0)
Total Lymphocyte: 55.9 %
WBC: 5.2 10*3/uL (ref 3.8–10.8)

## 2020-07-14 LAB — CK: Total CK: 93 U/L (ref 29–143)

## 2020-07-14 LAB — SEDIMENTATION RATE: Sed Rate: 11 mm/h (ref 0–20)

## 2020-07-14 LAB — ANA: Anti Nuclear Antibody (ANA): NEGATIVE

## 2020-07-14 LAB — HIGH SENSITIVITY CRP: hs-CRP: 0.6 mg/L

## 2020-07-14 LAB — RHEUMATOID FACTOR: Rheumatoid fact SerPl-aCnc: <14 IU/mL (ref <14)

## 2020-10-24 ENCOUNTER — Encounter: Payer: BC Managed Care – PPO | Admitting: Osteopathic Medicine

## 2020-10-26 ENCOUNTER — Other Ambulatory Visit: Payer: Self-pay

## 2020-10-26 ENCOUNTER — Ambulatory Visit (INDEPENDENT_AMBULATORY_CARE_PROVIDER_SITE_OTHER): Payer: BC Managed Care – PPO | Admitting: Osteopathic Medicine

## 2020-10-26 ENCOUNTER — Encounter: Payer: Self-pay | Admitting: Osteopathic Medicine

## 2020-10-26 VITALS — BP 130/87 | HR 73 | Temp 98.2°F | Wt 152.0 lb

## 2020-10-26 DIAGNOSIS — Z0000 Encounter for general adult medical examination without abnormal findings: Secondary | ICD-10-CM

## 2020-10-26 NOTE — Patient Instructions (Addendum)
General Preventive Care  Most recent routine screening labs: ordered.   Blood pressure goal 130/80 or less.   Tobacco: don't!   Alcohol: responsible moderation is ok for most adults - if you have concerns about your alcohol intake, please talk to me!   Exercise: as tolerated to reduce risk of cardiovascular disease and diabetes. Strength training will also prevent osteoporosis.   Mental health: if need for mental health care (medicines, counseling, other), or concerns about moods, please let me know!   Sexual / Reproductive health: if need for STD testing, or if concerns with libido/pain problems, please let me know! If you need to discuss family planning, please let me know!   Advanced Directive: Living Will and/or Healthcare Power of Attorney recommended for all adults, regardless of age or health.  Vaccines  Flu vaccine: for almost everyone, every fall/winter.   Shingles vaccine: after age 55.   Pneumonia vaccines: after age 57.  Tetanus booster: every 10 years - due 2025  COVID vaccine: THANKS for getting your vaccine! :)  Cancer screenings   Colon cancer screening: Cologuard can repeat 10/2022  Breast cancer screening: mammogram ordered, please call [phone]  Cervical cancer screening: Pap due 09/2022  Lung cancer screening: not needed for non-smokers.  Infection screenings  . HIV and Hepatitis C: recommended screening at least once age 15-65, more often as needed. Rondell Reams, other STI: screening as needed . TB: certain at-risk populations Other . Bone Density Test: recommended at age 26

## 2020-10-26 NOTE — Progress Notes (Signed)
Kathy Blankenship is a 50 y.o. female who presents to  [place] at [facility]  today, 10/26/20, seeking care for the following:  . Annual physical      ASSESSMENT & PLAN with other pertinent findings:  The encounter diagnosis was Annual physical exam.   Needs Mammo Room for improvement in diet/exercise  Otherwise recent labs ok Lipids were awesome last year, ok to defer checking this year   Patient Instructions  General Preventive Care  Most recent routine screening labs: ordered.   Blood pressure goal 130/80 or less.   Tobacco: don't!   Alcohol: responsible moderation is ok for most adults - if you have concerns about your alcohol intake, please talk to me!   Exercise: as tolerated to reduce risk of cardiovascular disease and diabetes. Strength training will also prevent osteoporosis.   Mental health: if need for mental health care (medicines, counseling, other), or concerns about moods, please let me know!   Sexual / Reproductive health: if need for STD testing, or if concerns with libido/pain problems, please let me know! If you need to discuss family planning, please let me know!   Advanced Directive: Living Will and/or Healthcare Power of Attorney recommended for all adults, regardless of age or health.  Vaccines  Flu vaccine: for almost everyone, every fall/winter.   Shingles vaccine: after age 83.   Pneumonia vaccines: after age 56.  Tetanus booster: every 10 years - due 2025  COVID vaccine: THANKS for getting your vaccine! :)  Cancer screenings   Colon cancer screening: Cologuard can repeat 10/2022  Breast cancer screening: mammogram ordered, please call [phone]  Cervical cancer screening: Pap due 09/2022  Lung cancer screening: not needed for non-smokers.  Infection screenings  . HIV and Hepatitis C: recommended screening at least once age 40-65, more often as needed. Rondell Reams, other STI:  screening as needed . TB: certain at-risk populations Other . Bone Density Test: recommended at age 25   No orders of the defined types were placed in this encounter.   No orders of the defined types were placed in this encounter.    See below for relevant physical exam findings  See below for recent lab and imaging results reviewed  Medications, allergies, PMH, PSH, SocH, [place] reviewed below    Follow-up instructions: Return in about 1 year (around 10/26/2021) for [place] (call week prior to visit for lab orders).                                        Exam:  BP 130/87 (BP Location: Left Arm, Patient Position: Sitting, Cuff Size: Normal)   Pulse 73   Temp 98.2 F (36.8 C) (Oral)   Wt 152 lb (68.9 kg)   BMI 25.29 kg/m   Constitutional: VS see above. General Appearance: alert, well-developed, well-nourished, NAD  Neck: No masses, trachea midline.   Respiratory: Normal respiratory effort. no wheeze, no rhonchi, no rales  Cardiovascular: S1/S2 normal, no murmur, no rub/gallop auscultated. RRR.   Musculoskeletal: Gait normal. Symmetric and independent movement of all extremities  Abdominal: non-tender, non-distended, no appreciable organomegaly, neg Murphy's, BS WNLx4  Neurological: Normal balance/coordination. No tremor.  Skin: warm, dry, intact.   Psychiatric: Normal judgment/insight. Normal mood and affect. Oriented x3.   Current Meds  Medication Sig  . ciclopirox (PENLAC) 8 % solution Apply topically at bedtime. Apply over nail  and surrounding skin. Apply daily over previous coat. After seven (7) days, may remove with alcohol and continue cycle up to 48 weeks    Allergies  Allergen Reactions  . Lamisil [Terbinafine] Rash  . Terbinafine Rash    Patient Active Problem List   Diagnosis Date Noted  . Polyarthralgia 06/16/2019  . Spondylolisthesis at L5-S1 level 06/16/2019  . Urticaria 03/03/2017  . Muscle cramp 08/26/2016   . Onychomycosis 08/26/2016  . Other fatigue 08/26/2016  . Irregular menstrual cycle 03/26/2016  . Vitamin D deficiency 01/26/2014  . Numbness and tingling in left upper extremity 01/14/2014  . Hyperlipidemia 12/20/2013  . TIA (transient ischemic attack) 12/20/2013  . Seasonal allergies 12/20/2013  . SINUSITIS, CHRONIC 04/27/2008  . ALLERGIC RHINITIS 04/19/2008  . RHINITIS 03/08/2008  . DYSPNEA 03/08/2008    Family History  Problem Relation Age of Onset  . Rheum arthritis Father   . Heart attack Father   . Hypertension Mother     Social History   Tobacco Use  Smoking Status Never Smoker  Smokeless Tobacco Never Used    Past Surgical History:  Procedure Laterality Date  . COLONOSCOPY  2006  . MOLE REMOVAL  1995    Immunization History  Administered Date(s) Administered  . Influenza Whole 03/08/2008  . Influenza,inj,Quad PF,6+ Mos 04/23/2016, 03/03/2017, 03/14/2018, 02/06/2019  . Influenza,inj,quad, With Preservative 02/19/2014  . Influenza-Unspecified 02/19/2014, 02/25/2020  . Moderna Sars-Covid-2 Vaccination 08/19/2019, 09/16/2019, 03/27/2020  . Tdap 12/20/2013    No results found for this or any previous visit (from the past 2160 hour(s)).  No results found.     All questions at time of visit were answered - patient instructed to contact office with any additional concerns or updates. ER/RTC precautions were reviewed with the patient as applicable.   Please note: manual typing as well as voice recognition software may have been used to produce this document - typos may escape review. Please contact Dr. Sheppard Coil for any needed clarifications.

## 2020-12-05 ENCOUNTER — Ambulatory Visit (INDEPENDENT_AMBULATORY_CARE_PROVIDER_SITE_OTHER): Payer: BC Managed Care – PPO | Admitting: Family Medicine

## 2020-12-05 ENCOUNTER — Other Ambulatory Visit: Payer: Self-pay

## 2020-12-05 ENCOUNTER — Encounter: Payer: Self-pay | Admitting: Family Medicine

## 2020-12-05 VITALS — BP 108/69 | HR 84 | Temp 98.1°F | Resp 16

## 2020-12-05 DIAGNOSIS — H6121 Impacted cerumen, right ear: Secondary | ICD-10-CM

## 2020-12-05 DIAGNOSIS — B351 Tinea unguium: Secondary | ICD-10-CM | POA: Diagnosis not present

## 2020-12-05 NOTE — Progress Notes (Signed)
Acute Office Visit  Subjective:    Patient ID: Kathy Blankenship, female    DOB: 1970/11/09, 50 y.o.   MRN: [id number]  Chief Complaint  Patient presents with   Cerumen Impaction    HPI Patient is in today for right ear pressure.  Last week patient was in the shower using a wash cloth to clean out her ears when she felt a "blockage" push into her right ear. She reports fullness/pressure sensation but no pain. She is having some muffled hearing on that side. Denies any fevers, drainage, bleeding, recent colds/infections.    Also requesting podiatry referral for toe fungus - PCP has been treating with Penlac, but told her if unsuccessful will refer to podiatry.    Past Medical History:  Diagnosis Date   Fibroids    HSV-2 (herpes simplex virus 2) infection    Left ovarian cyst     Past Surgical History:  Procedure Laterality Date   COLONOSCOPY  2006   MOLE REMOVAL  1995    Family History  Problem Relation Age of Onset   Rheum arthritis Father    Heart attack Father    Hypertension Mother     Social History   Socioeconomic History   Marital status: Married    Spouse name: Not on file   Number of children: Not on file   Years of education: Not on file   Highest education level: Not on file  Occupational History   Not on file  Tobacco Use   Smoking status: Never   Smokeless tobacco: Never  Vaping Use   Vaping Use: Never used  Substance and Sexual Activity   Alcohol use: No   Drug use: No   Sexual activity: Yes    Partners: Male    Birth control/protection: Condom, Pill    Comment: zenchent fe  Other Topics Concern   Not on file  Social History Narrative   Not on file   Social Determinants of Health   Financial Resource Strain: Not on file  Food Insecurity: Not on file  Transportation Needs: Not on file  Physical Activity: Not on file  Stress: Not on file  Social Connections: Not on file  Intimate Partner Violence: Not on file    Outpatient  Medications Prior to Visit  Medication Sig Dispense Refill   ciclopirox (PENLAC) 8 % solution Apply topically at bedtime. Apply over nail and surrounding skin. Apply daily over previous coat. After seven (7) days, may remove with alcohol and continue cycle up to 48 weeks 6 mL 6   meloxicam (MOBIC) 15 MG tablet One tab PO qAM with a meal for 2 weeks, then daily prn pain. (Patient not taking: No sig reported) 30 tablet 3   No facility-administered medications prior to visit.    Allergies  Allergen Reactions   Lamisil [Terbinafine] Rash   Terbinafine Rash    Review of Systems All review of systems negative except what is listed in the HPI     Objective:    Physical Exam Vitals reviewed.  Constitutional:      Appearance: Normal appearance.  HENT:     Head: Normocephalic and atraumatic.     Right Ear: There is impacted cerumen.     Left Ear: Tympanic membrane normal.  Musculoskeletal:     Cervical back: Normal range of motion and neck supple. No tenderness.  Lymphadenopathy:     Cervical: No cervical adenopathy.  Skin:    General: Skin is warm and dry.  Findings: No rash.     Comments: L great toe with onychomycosis  Neurological:     General: No focal deficit present.     Mental Status: She is alert and oriented to person, place, and time. Mental status is at baseline.  Psychiatric:        Mood and Affect: Mood normal.        Behavior: Behavior normal.        Thought Content: Thought content normal.        Judgment: Judgment normal.    BP 108/69   Pulse 84   Temp 98.1 F (36.7 C)   Resp 16   SpO2 99%  Wt Readings from Last 3 Encounters:  10/26/20 152 lb (68.9 kg)  07/12/20 151 lb (68.5 kg)  10/22/19 151 lb (68.5 kg)    Health Maintenance Due  Topic Date Due   HIV Screening  Never done   Hepatitis C Screening  Never done    There are no preventive care reminders to display for this patient.   Lab Results  Component Value Date   TSH 2.31 08/26/2016    Lab Results  Component Value Date   WBC 5.2 07/12/2020   HGB 13.1 07/12/2020   HCT 39.0 07/12/2020   MCV 83.7 07/12/2020   PLT 221 07/12/2020   Lab Results  Component Value Date   NA 145 07/12/2020   K 4.0 07/12/2020   CO2 30 07/12/2020   GLUCOSE 75 07/12/2020   BUN 10 07/12/2020   CREATININE 0.66 07/12/2020   BILITOT 1.0 07/12/2020   ALKPHOS 57 08/26/2016   AST 14 07/12/2020   ALT 12 07/12/2020   PROT 7.4 07/12/2020   ALBUMIN 4.4 08/26/2016   CALCIUM 10.0 07/12/2020   Lab Results  Component Value Date   CHOL 169 10/22/2019   Lab Results  Component Value Date   HDL 54 10/22/2019   Lab Results  Component Value Date   LDLCALC 101 (H) 10/22/2019   Lab Results  Component Value Date   TRIG 56 10/22/2019   Lab Results  Component Value Date   CHOLHDL 3.1 10/22/2019   No results found for: HGBA1C     Assessment & Plan:   1. Impacted cerumen of right ear  Indication: Cerumen impaction of the ear(s) Performed by Suann Larry, CMA with provider available Medical necessity statement: On physical examination, cerumen impairs clinically significant portions of the external auditory canal, and tympanic membrane. Noted obstructive, copious cerumen that cannot be removed without magnification and instrumentations requiring professional removal.   Consent: Discussed benefits and risks of procedure and verbal consent obtained  Procedure: Patient was prepped for the procedure. Otoscope utilized to assess and take note of the ear canal, the tympanic membrane, and the presence, amount, and placement of the cerumen.  Gentle irrigation with water at body temperature and soft plastic curette utilized to remove impacted cerumen.  Excess water drained by gravity and ear canal(s) dried with clean guaze.  Post procedure examination: Otoscopic examination reveals complete cerumen removal with no damage to the auditory canal, tympanic membrane, or surrounding tissue.  Patient tolerated  procedure well.   Post procedure instructions: Patient made aware that they may experience temporary vertigo, temporary changes in hearing, and temporary discomfort. If these symptom last for more than 24 hours to call the clinic or proceed to the ED for further evaluation. Discussed avoiding placing objects into the ear canal for cleaning.    2. Onychomycosis Patient failed Penlac and requests podiatry  referral. Placed today.   - Ambulatory referral to Podiatry   Follow-up as needed.   [facility] Olevia Bowens, DNP, FNP-C

## 2020-12-05 NOTE — Patient Instructions (Signed)
Try over-the-counter Debrox drops occasionally to keep wax from becoming impacted.

## 2020-12-08 ENCOUNTER — Ambulatory Visit (INDEPENDENT_AMBULATORY_CARE_PROVIDER_SITE_OTHER): Payer: BC Managed Care – PPO | Admitting: Podiatry

## 2020-12-08 ENCOUNTER — Other Ambulatory Visit: Payer: Self-pay | Admitting: Osteopathic Medicine

## 2020-12-08 ENCOUNTER — Other Ambulatory Visit: Payer: Self-pay

## 2020-12-08 ENCOUNTER — Encounter: Payer: Self-pay | Admitting: Podiatry

## 2020-12-08 DIAGNOSIS — Z1231 Encounter for screening mammogram for malignant neoplasm of breast: Secondary | ICD-10-CM

## 2020-12-08 DIAGNOSIS — B351 Tinea unguium: Secondary | ICD-10-CM | POA: Diagnosis not present

## 2020-12-08 NOTE — Patient Instructions (Signed)
I have ordered a medication for you that will come from [place] in [place]. They should be calling you to verify insurance and will mail the medication to you. If you live close by then you can go by their pharmacy to pick up the medication. Their phone number is [phone]. If you do not hear from them in the next few days, please give [place] a call at [phone].

## 2020-12-13 NOTE — Progress Notes (Signed)
Subjective:   Patient ID: Kathy Blankenship, female   DOB: 50 y.o.   MRN: [id number]   HPI 50 year old female presents the office today for concerns of toenail fungus which is been ongoing for last couple of years.  She states that her big toenails are most affected to get thick.  She tried Lamisil but developed a rash and had to stop this.  She is been using ciclopirox topically.  This did help some.  She does get some occasional discomfort of the nail.  Denies any pain. No redness or any drainage.  She has no other concerns today.   Review of Systems  All other systems reviewed and are negative.  Past Medical History:  Diagnosis Date   Fibroids    HSV-2 (herpes simplex virus 2) infection    Left ovarian cyst     Past Surgical History:  Procedure Laterality Date   COLONOSCOPY  2006   MOLE REMOVAL  1995     Current Outpatient Medications:    NON FORMULARY, [facility]  Antifungal (nail)-#1  LAMISIL ALLERGY, Disp: , Rfl:    ciclopirox (PENLAC) 8 % solution, Apply topically at bedtime. Apply over nail and surrounding skin. Apply daily over previous coat. After seven (7) days, may remove with alcohol and continue cycle up to 48 weeks, Disp: 6 mL, Rfl: 6   meloxicam (MOBIC) 15 MG tablet, One tab PO qAM with a meal for 2 weeks, then daily prn pain. (Patient not taking: No sig reported), Disp: 30 tablet, Rfl: 3  Allergies  Allergen Reactions   Lamisil [Terbinafine] Rash   Terbinafine Rash          Objective:  Physical Exam  General: AAO x3, NAD  Dermatological: Nails are very hypertrophic, dystrophic with yellow-brown discoloration.  There is no pain in the nail there is no redness or drainage or any signs of infection.  No open lesions.  Vascular: Dorsalis Pedis artery and Posterior Tibial artery pedal pulses are 2/4 bilateral with immedate capillary fill time.There is no pain with calf compression, swelling, warmth, erythema.   Neruologic: Grossly intact via light touch  bilateral.   Musculoskeletal: No gross boney pedal deformities bilateral. No pain, crepitus, or limitation noted with foot and ankle range of motion bilateral. Muscular strength 5/5 in all groups tested bilateral.  Gait: Unassisted, Nonantalgic.       Assessment:   Onychomycosis     Plan:  -Treatment options discussed including all alternatives, risks, and complications -Etiology of symptoms were discussed -We discussed oral, topical, oral alternative treatments for nail fungus.  After discussion was to hold off any oral medication.  I ordered a compound cream today through [facility] for nail fungus.  Discussed side effects, success rates.  Trula Slade DPM

## 2020-12-27 ENCOUNTER — Ambulatory Visit (INDEPENDENT_AMBULATORY_CARE_PROVIDER_SITE_OTHER): Payer: BC Managed Care – PPO

## 2020-12-27 ENCOUNTER — Other Ambulatory Visit: Payer: Self-pay

## 2020-12-27 DIAGNOSIS — Z1231 Encounter for screening mammogram for malignant neoplasm of breast: Secondary | ICD-10-CM

## 2021-03-02 ENCOUNTER — Encounter: Payer: Self-pay | Admitting: Podiatry

## 2021-03-02 ENCOUNTER — Ambulatory Visit (INDEPENDENT_AMBULATORY_CARE_PROVIDER_SITE_OTHER): Payer: BC Managed Care – PPO | Admitting: Podiatry

## 2021-03-02 ENCOUNTER — Other Ambulatory Visit: Payer: Self-pay

## 2021-03-02 DIAGNOSIS — B351 Tinea unguium: Secondary | ICD-10-CM | POA: Diagnosis not present

## 2021-03-02 NOTE — Addendum Note (Signed)
Addended by: Cranford Mon R on: 03/02/2021 08:59 AM   Modules accepted: Orders

## 2021-03-02 NOTE — Progress Notes (Signed)
Subjective:   Patient ID: Kathy Blankenship, female   DOB: 50 y.o.   MRN: [id number]   HPI 50 year old female presents the office today for follow-up of toenail fungus. She has been using a topical cream from [employer] with some improvement on the right but her first and third toes on the left are still affected. Patient here to see if any other treatment options.    Denies any pain. No redness or any drainage.  She has no other concerns today.   Review of Systems  All other systems reviewed and are negative.  Past Medical History:  Diagnosis Date   Fibroids    HSV-2 (herpes simplex virus 2) infection    Left ovarian cyst     Past Surgical History:  Procedure Laterality Date   COLONOSCOPY  2006   MOLE REMOVAL  1995     Current Outpatient Medications:    ciclopirox (PENLAC) 8 % solution, Apply topically at bedtime. Apply over nail and surrounding skin. Apply daily over previous coat. After seven (7) days, may remove with alcohol and continue cycle up to 48 weeks, Disp: 6 mL, Rfl: 6   NON FORMULARY, [facility]  Antifungal (nail)-#1  LAMISIL ALLERGY, Disp: , Rfl:   Allergies  Allergen Reactions   Lamisil [Terbinafine] Rash   Terbinafine Rash          Objective:  Physical Exam  General: AAO x3, NAD  Dermatological: Nails one and three on left are very hypertrophic, dystrophic with yellow-brown discoloration.  There is no pain in the nail there is no redness or drainage or any signs of infection.  No open lesions.  Vascular: Dorsalis Pedis artery and Posterior Tibial artery pedal pulses are 2/4 bilateral with immedate capillary fill time.There is no pain with calf compression, swelling, warmth, erythema.   Neruologic: Grossly intact via light touch bilateral.   Musculoskeletal: No gross boney pedal deformities bilateral. No pain, crepitus, or limitation noted with foot and ankle range of motion bilateral. Muscular strength 5/5 in all groups tested  bilateral.  Gait: Unassisted, Nonantalgic.       Assessment:   Onychomycosis     Plan:  -Treatment options discussed including all alternatives, risks, and complications -Etiology of symptoms were discussed -Examined patient -Discussed treatment options for painful dystrophic nails  -Clinical picture and Fungal culture was obtained by removing a portion of the hard nail itself from each of the involved toenails using a sterile nail nipper and sent to [facility] lab. Patient tolerated the biopsy procedure well without discomfort or need for anesthesia.  -Discussed fungal nail treatment options including oral, topical, and laser treatments.  -Patient to return in 4 weeks for follow up evaluation and discussion of fungal culture results or sooner if symptoms worsen.   Lorenda Peck DPM

## 2021-06-25 ENCOUNTER — Encounter: Payer: Self-pay | Admitting: Physician Assistant

## 2021-06-25 ENCOUNTER — Other Ambulatory Visit: Payer: Self-pay

## 2021-06-25 ENCOUNTER — Ambulatory Visit (INDEPENDENT_AMBULATORY_CARE_PROVIDER_SITE_OTHER): Payer: BC Managed Care – PPO | Admitting: Physician Assistant

## 2021-06-25 VITALS — BP 125/50 | Temp 98.5°F | Ht 65.0 in | Wt 159.0 lb

## 2021-06-25 DIAGNOSIS — J069 Acute upper respiratory infection, unspecified: Secondary | ICD-10-CM | POA: Diagnosis not present

## 2021-06-25 LAB — POCT INFLUENZA A/B
Influenza A, POC: NEGATIVE
Influenza B, POC: NEGATIVE

## 2021-06-25 LAB — POCT RAPID STREP A (OFFICE): Rapid Strep A Screen: NEGATIVE

## 2021-06-25 MED ORDER — BENZONATATE 200 MG PO CAPS: 200.0000 mg | ORAL_CAPSULE | Freq: Three times a day (TID) | ORAL | 0 refills | Status: DC | PRN

## 2021-06-25 NOTE — Patient Instructions (Addendum)
Kathy Blankenship med sinus rinse  Upper Respiratory Infection, Adult An upper respiratory infection (URI) affects the nose, throat, and upper airways that lead to the lungs. The most common type of URI is often called the common cold. URIs usually get better on their own, without medical treatment. What are the causes? A URI is caused by a germ (virus). You may catch these germs by: Breathing in droplets from an infected person's cough or sneeze. Touching something that has the germ on it (is contaminated) and then touching your mouth, nose, or eyes. What increases the risk? You are more likely to get a URI if: You are very young or very old. You have close contact with others, such as at work, school, or a health care facility. You smoke. You have long-term (chronic) heart or lung disease. You have a weakened disease-fighting system (immune system). You have nasal allergies or asthma. You have a lot of stress. You have poor nutrition. What are the signs or symptoms? Runny or stuffy (congested) nose. Cough. Sneezing. Sore throat. Headache. Feeling tired (fatigue). Fever. Not wanting to eat as much as usual. Pain in your forehead, behind your eyes, and over your cheekbones (sinus pain). Muscle aches. Redness or irritation of the eyes. Pressure in the ears or face. How is this treated? URIs usually get better on their own within 7-10 days. Medicines cannot cure URIs, but your doctor may recommend certain medicines to help relieve symptoms, such as: Over-the-counter cold medicines. Medicines to reduce coughing (cough suppressants). Coughing is a type of defense against infection that helps to clear the nose, throat, windpipe, and lungs (respiratory system). Take these medicines only as told by your doctor. Medicines to lower your fever. Follow these instructions at home: Activity Rest as needed. If you have a fever, stay home from work or school until your fever is gone, or until your doctor  says you may return to work or school. You should stay home until you cannot spread the infection anymore (you are not contagious). Your doctor may have you wear a face mask so you have less risk of spreading the infection. Relieving symptoms Rinse your mouth often with salt water. To make salt water, dissolve -1 tsp (3-6 g) of salt in 1 cup (237 mL) of warm water. Use a cool-mist humidifier to add moisture to the air. This can help you breathe more easily. Eating and drinking  Drink enough fluid to keep your pee (urine) pale yellow. Eat soups and other clear broths. General instructions  Take over-the-counter and prescription medicines only as told by your doctor. Do not smoke or use any products that contain nicotine or tobacco. If you need help quitting, ask your doctor. Avoid being where people are smoking (avoid secondhand smoke). Stay up to date on all your shots (immunizations), and get the flu shot every year. Keep all follow-up visits. How to prevent the spread of infection to others  Wash your hands with soap and water for at least 20 seconds. If you cannot use soap and water, use hand sanitizer. Avoid touching your mouth, face, eyes, or nose. Cough or sneeze into a tissue or your sleeve or elbow. Do not cough or sneeze into your hand or into the air. Contact a doctor if: You are getting worse, not better. You have any of these: A fever or chills. Brown or red mucus in your nose. Yellow or brown fluid (discharge)coming from your nose. Pain in your face, especially when you bend forward. Swollen neck  glands. Pain when you swallow. White areas in the back of your throat. Get help right away if: You have shortness of breath that gets worse. You have very bad or constant: Headache. Ear pain. Pain in your forehead, behind your eyes, and over your cheekbones (sinus pain). Chest pain. You have long-lasting (chronic) lung disease along with any of these: Making high-pitched  whistling sounds when you breathe, most often when you breathe out (wheezing). Long-lasting cough (more than 14 days). Coughing up blood. A change in your usual mucus. You have a stiff neck. You have changes in your: Vision. Hearing. Thinking. Mood. These symptoms may be an emergency. Get help right away. Call 911. Do not wait to see if the symptoms will go away. Do not drive yourself to the hospital. Summary An upper respiratory infection (URI) is caused by a germ (virus). The most common type of URI is often called the common cold. URIs usually get better within 7-10 days. Take over-the-counter and prescription medicines only as told by your doctor. This information is not intended to replace advice given to you by your health care provider. Make sure you discuss any questions you have with your health care provider. Document Revised: 12/13/2020 Document Reviewed: 12/13/2020 Elsevier Patient Education  [place].

## 2021-06-25 NOTE — Progress Notes (Signed)
Acute Office Visit  Subjective:    Patient ID: Tyarra Nolton, female    DOB: 11-09-1970, 51 y.o.   MRN: [id number]  Chief Complaint  Patient presents with   URI    HPI Patient is in today for cold symptoms. She admits to a sore throat and nasal congestion that started on Saturday. She has had clear nasal drainage, a dry cough, fatigue, and minimal body aches. She has been taking dayquil and nyquil for symptom control. She denies a fever, chills, and headache. She tested negative for covid with an at-home test on Saturday. She works at a bank. Her husband was sick the week before and felt better in a few days without any intervention.   Review of Systems  Constitutional:  Positive for fatigue. Negative for chills, diaphoresis and fever.  HENT:  Positive for congestion, rhinorrhea, sinus pressure and sore throat. Negative for ear pain, sinus pain and trouble swallowing.   Respiratory:  Positive for cough. Negative for chest tightness and shortness of breath.   Cardiovascular:  Negative for chest pain.  Gastrointestinal:  Negative for abdominal pain, constipation, diarrhea, nausea and vomiting.      Objective:    Physical Exam Constitutional:      General: She is not in acute distress.    Appearance: Normal appearance. She is normal weight. She is not ill-appearing, toxic-appearing or diaphoretic.  HENT:     Head: Normocephalic and atraumatic.     Comments: Bilateral ceruminosis in ear canal, not impacted TM visualized, normal    Right Ear: Tympanic membrane normal.     Left Ear: Tympanic membrane normal.     Nose: Nose normal. No congestion.     Mouth/Throat:     Mouth: Mucous membranes are moist.     Pharynx: No oropharyngeal exudate or posterior oropharyngeal erythema.  Eyes:     Conjunctiva/sclera: Conjunctivae normal.  Cardiovascular:     Rate and Rhythm: Normal rate and regular rhythm.     Heart sounds: Normal heart sounds.  Pulmonary:     Effort: Pulmonary effort is  normal.     Breath sounds: Normal breath sounds.  Abdominal:     General: Abdomen is flat. There is no distension.     Palpations: Abdomen is soft.     Tenderness: There is no abdominal tenderness.  Musculoskeletal:        General: Normal range of motion.  Skin:    General: Skin is warm and dry.  Neurological:     Mental Status: She is alert and oriented to person, place, and time.  Psychiatric:        Mood and Affect: Mood normal.        Behavior: Behavior normal.        Thought Content: Thought content normal.        Judgment: Judgment normal.    BP (!) 125/50    Temp 98.5 F (36.9 C)    Ht 5' 5"  (1.651 m)    Wt 159 lb (72.1 kg)    SpO2 99%    BMI 26.46 kg/m  Wt Readings from Last 3 Encounters:  06/25/21 159 lb (72.1 kg)  10/26/20 152 lb (68.9 kg)  07/12/20 151 lb (68.5 kg)    Health Maintenance Due  Topic Date Due   HIV Screening  Never done   Hepatitis C Screening  Never done   COVID-19 Vaccine (4 - Booster for Moderna series) 05/22/2020   INFLUENZA VACCINE  12/25/2020    There  are no preventive care reminders to display for this patient. .. Today's Vitals   06/25/21 1116  BP: (!) 125/50  Temp: 98.5 F (36.9 C)  SpO2: 99%  Weight: 159 lb (72.1 kg)  Height: 5' 5"  (1.651 m)   Body mass index is 26.46 kg/m.  .. Results for orders placed or performed in visit on 06/25/21  POCT Influenza A/B  Result Value Ref Range   Influenza A, POC Negative Negative   Influenza B, POC Negative Negative  POCT rapid strep A  Result Value Ref Range   Rapid Strep A Screen Negative Negative       Assessment & Plan:  Marland KitchenMarland KitchenJohan was seen today for uri.  Diagnoses and all orders for this visit:  Viral URI with cough -     benzonatate (TESSALON) 200 MG capsule; Take 1 capsule (200 mg total) by mouth 3 (three) times daily as needed. -     POCT Influenza A/B -     POCT rapid strep A   Negative flu and strep.  Home covid test negative Discussed symptomatic care. HO  given.  Tessalon pearls for cough Continue OTC dayquil and nyquil Ok to go back to work Wednesday Follow up if symptoms worsening or changing   Iran Planas, PA-C

## 2021-09-17 ENCOUNTER — Ambulatory Visit (INDEPENDENT_AMBULATORY_CARE_PROVIDER_SITE_OTHER): Payer: BC Managed Care – PPO

## 2021-09-17 ENCOUNTER — Ambulatory Visit (INDEPENDENT_AMBULATORY_CARE_PROVIDER_SITE_OTHER): Payer: BC Managed Care – PPO | Admitting: Sports Medicine

## 2021-09-17 DIAGNOSIS — M4317 Spondylolisthesis, lumbosacral region: Secondary | ICD-10-CM | POA: Diagnosis not present

## 2021-09-17 MED ORDER — PREDNISONE 50 MG PO TABS: ORAL_TABLET | ORAL | 0 refills | Status: DC

## 2021-09-17 NOTE — Assessment & Plan Note (Signed)
This is a very pleasant 51 year old female, grade 2 L5-S1 spinal listhesis, she does have bilateral radicular symptoms and bilateral pars interarticularis defects on an x-ray. ?Having increasing pain with prolonged standing, radicular down to the anterior shins and the feet. ?Adding 5 days of prednisone, updated x-rays, updated home conditioning, basic and advanced. ?We will touch base again in about 6 weeks and consider MRI if not sufficiently better. ?Neuropathic agents such as gabapentin and Lyrica also remain an option. ?

## 2021-09-17 NOTE — Progress Notes (Signed)
? ? ?  Procedures performed today:   ? ?None. ? ?Independent interpretation of notes and tests performed by another provider:  ? ?None. ? ?Brief History, Exam, Impression, and Recommendations:   ? ?Spondylolisthesis at L5-S1 level ?This is a very pleasant 51 year old female, grade 2 L5-S1 spinal listhesis, she does have bilateral radicular symptoms and bilateral pars interarticularis defects on an x-ray. ?Having increasing pain with prolonged standing, radicular down to the anterior shins and the feet. ?Adding 5 days of prednisone, updated x-rays, updated home conditioning, basic and advanced. ?We will touch base again in about 6 weeks and consider MRI if not sufficiently better. ?Neuropathic agents such as gabapentin and Lyrica also remain an option. ? ? ? ?___________________________________________ ?Gwen Her. Dianah Field, M.D., ABFM., CAQSM. ?Primary Care and Sports Medicine ?[place] ? ?Adjunct Instructor of Family Medicine  ?[employer] ?

## 2021-10-29 ENCOUNTER — Ambulatory Visit (INDEPENDENT_AMBULATORY_CARE_PROVIDER_SITE_OTHER): Payer: BC Managed Care – PPO | Admitting: Sports Medicine

## 2021-10-29 DIAGNOSIS — M4317 Spondylolisthesis, lumbosacral region: Secondary | ICD-10-CM | POA: Diagnosis not present

## 2021-10-29 NOTE — Progress Notes (Signed)
    Procedures performed today:    None.  Independent interpretation of notes and tests performed by another provider:   None.  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 We treated her conservatively with home conditioning, both basic and advanced. We called in some prednisone but she never ended up needing it. She returns today with complete resolution of her symptoms, she understands the importance of continued core conditioning and stabilization of the lumbar spine in a patient with spondylolisthesis of her severity. She can keep the prednisone in the medicine cabinet, take it with her on trips should she need it, and return to see me on an as-needed basis.    ___________________________________________ Gwen Her. Dianah Field, M.D., ABFM., CAQSM. Primary Care and [place] Instructor of [place] [employer]

## 2021-10-29 NOTE — Assessment & Plan Note (Signed)
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 We treated her conservatively with home conditioning, both basic and advanced. We called in some prednisone but she never ended up needing it. She returns today with complete resolution of her symptoms, she understands the importance of continued core conditioning and stabilization of the lumbar spine in a patient with spondylolisthesis of her severity. She can keep the prednisone in the medicine cabinet, take it with her on trips should she need it, and return to see me on an as-needed basis.

## 2021-10-31 ENCOUNTER — Ambulatory Visit (INDEPENDENT_AMBULATORY_CARE_PROVIDER_SITE_OTHER): Payer: BC Managed Care – PPO | Admitting: Medical-Surgical

## 2021-10-31 ENCOUNTER — Encounter: Payer: BC Managed Care – PPO | Admitting: Osteopathic Medicine

## 2021-10-31 ENCOUNTER — Encounter: Payer: Self-pay | Admitting: Medical-Surgical

## 2021-10-31 VITALS — BP 101/71 | HR 81 | Resp 20 | Ht 65.0 in | Wt 160.9 lb

## 2021-10-31 DIAGNOSIS — E559 Vitamin D deficiency, unspecified: Secondary | ICD-10-CM

## 2021-10-31 DIAGNOSIS — Z0000 Encounter for general adult medical examination without abnormal findings: Secondary | ICD-10-CM | POA: Diagnosis not present

## 2021-10-31 DIAGNOSIS — R002 Palpitations: Secondary | ICD-10-CM | POA: Insufficient documentation

## 2021-10-31 DIAGNOSIS — E785 Hyperlipidemia, unspecified: Secondary | ICD-10-CM | POA: Diagnosis not present

## 2021-10-31 NOTE — Progress Notes (Signed)
HPI: Kathy Blankenship is a 51 y.o. female who  has a past medical history of Fibroids, HSV-2 (herpes simplex virus 2) infection, Irregular menstrual cycle (03/26/2016), and Left ovarian cyst.  she presents to [facility] today, 10/31/21,  for chief complaint of: Annual physical exam  Dentist: UTD, no concerns Eye exam: UTD, starting to wear reading glasses Exercise: not as much as she needs to, wants to do more Diet: portion control, limiting bread, not big on sweets Pap smear: 2019, normal, due in 5 years Mammogram: 8/22, normal Colon cancer screening: Cologuard normal in 2021  Concerns: Intermittent palpitations  Past medical, surgical, social and family history reviewed:  Patient Active Problem List   Diagnosis Date Noted   Intermittent palpitations 10/31/2021   Polyarthralgia 06/16/2019   Spondylolisthesis at L5-S1 level 06/16/2019   Urticaria 03/03/2017   Muscle cramp 08/26/2016   Other fatigue 08/26/2016   Vitamin D deficiency 01/26/2014   Numbness and tingling in left upper extremity 01/14/2014   Hyperlipidemia 12/20/2013   TIA (transient ischemic attack) 12/20/2013   Seasonal allergies 12/20/2013   Weakness of left arm 12/14/2013   DYSPNEA 03/08/2008    Past Surgical History:  Procedure Laterality Date   COLONOSCOPY  2006   MOLE REMOVAL  1995    Social History   Tobacco Use   Smoking status: Never   Smokeless tobacco: Never  Substance Use Topics   Alcohol use: No    Family History  Problem Relation Age of Onset   Rheum arthritis Father    Heart attack Father    Hypertension Mother      Current medication list and allergy/intolerance information reviewed:    No current outpatient medications on file.   No current facility-administered medications for this visit.    Allergies  Allergen Reactions   Lamisil [Terbinafine] Rash   Terbinafine Rash    Review of Systems: Constitutional:  No  fever, no chills, No  recent illness, No unintentional weight changes. No significant fatigue.  HEENT: No  headache, no vision change, no hearing change, No sore throat, No  sinus pressure Cardiac: No  chest pain, No  pressure, + infrequent palpitations, No  Orthopnea Respiratory:  No  shortness of breath. No  Cough Gastrointestinal: No  abdominal pain, No  nausea, No  vomiting,  No  blood in stool, No  diarrhea, No  constipation  Musculoskeletal: No new myalgia/arthralgia Skin: No  Rash, No other wounds/concerning lesions Genitourinary: No  incontinence, No  abnormal genital bleeding, No abnormal genital discharge Hem/Onc: No  easy bruising/bleeding, No  abnormal lymph node Endocrine: No cold intolerance,  No heat intolerance. No polyuria/polydipsia/polyphagia  Neurologic: No  weakness, No  dizziness, No  slurred speech/focal weakness/facial droop Psychiatric: No  concerns with depression, No  concerns with anxiety, No sleep problems, No mood problems  Exam:  BP 101/71 (BP Location: Left Arm, Cuff Size: Large)   Pulse 81   Resp 20   Ht 5' 5"  (8.676 m)   Wt 160 lb 14.4 oz (73 kg)   SpO2 97%   BMI 26.78 kg/m  Constitutional: VS see above. General Appearance: alert, well-developed, well-nourished, NAD Eyes: Normal lids and conjunctive, non-icteric sclera Ears, Nose, Mouth, Throat: MMM, Normal external inspection ears/nares/mouth/lips/gums. TM normal bilaterally. Pharynx/tonsils no erythema, no exudate. Nasal mucosa normal.  Neck: No masses, trachea midline. No thyroid enlargement. No tenderness/mass appreciated. No lymphadenopathy Respiratory: Normal respiratory effort. no wheeze, no rhonchi, no rales Cardiovascular: S1/S2 normal, no murmur, no  rub/gallop auscultated. RRR. No lower extremity edema. Pedal pulse II/IV bilaterally DP and PT. No carotid bruit or JVD. No abdominal aortic bruit. Gastrointestinal: Nontender, no masses. No hepatomegaly, no splenomegaly. No hernia appreciated. Bowel sounds normal. Rectal  exam deferred.  Musculoskeletal: Gait normal. No clubbing/cyanosis of digits.  Neurological: Normal balance/coordination. No tremor. No cranial nerve deficit on limited exam. Motor and sensation intact and symmetric. Cerebellar reflexes intact.  Skin: warm, dry, intact. No rash/ulcer. No concerning nevi or subq nodules on limited exam.   Psychiatric: Normal judgment/insight. Normal mood and affect. Oriented x3.    ASSESSMENT/PLAN:   1. Annual physical exam Checking labs today.  Up-to-date on preventative care.  Wellness information provided with AVS. - Lipid panel - COMPLETE METABOLIC PANEL WITH GFR - CBC with Differential/Platelet  2. Hyperlipidemia, unspecified hyperlipidemia type Checking lipid panel today. - Lipid panel  3. Vitamin D deficiency Checking vitamin D today. - VITAMIN D 25 Hydroxy (Vit-D Deficiency, Fractures)  4. Intermittent palpitations Very infrequent palpitations over the past couple of months.  Checking TSH in addition to labs as above.  Advised patient to monitor for increased frequency.  If that happens, we may need to investigate with Zio patch - TSH  No orders of the defined types were placed in this encounter.   Patient Instructions  Macro diet recipes Reverse dieting  Follow-up plan: Return in about 1 year (around 11/01/2022) for annual physical exam or sooner if needed.  Clearnce Sorrel, DNP, APRN, FNP-BC [place] [facility]

## 2021-10-31 NOTE — Patient Instructions (Signed)
Macro diet recipes Reverse dieting

## 2021-11-01 ENCOUNTER — Other Ambulatory Visit: Payer: Self-pay | Admitting: Neurology

## 2021-11-01 DIAGNOSIS — E559 Vitamin D deficiency, unspecified: Secondary | ICD-10-CM

## 2021-11-01 LAB — COMPLETE METABOLIC PANEL WITH GFR
AG Ratio: 1.5 (calc) (ref 1.0–2.5)
ALT: 16 U/L (ref 6–29)
AST: 16 U/L (ref 10–35)
Albumin: 4.4 g/dL (ref 3.6–5.1)
Alkaline phosphatase (APISO): 72 U/L (ref 31–125)
BUN: 11 mg/dL (ref 7–25)
CO2: 27 mmol/L (ref 20–32)
Calcium: 9.9 mg/dL (ref 8.6–10.2)
Chloride: 107 mmol/L (ref 98–110)
Creat: 0.77 mg/dL (ref 0.50–0.99)
Globulin: 2.9 g/dL (calc) (ref 1.9–3.7)
Glucose, Bld: 92 mg/dL (ref 65–99)
Potassium: 4.2 mmol/L (ref 3.5–5.3)
Sodium: 142 mmol/L (ref 135–146)
Total Bilirubin: 0.7 mg/dL (ref 0.2–1.2)
Total Protein: 7.3 g/dL (ref 6.1–8.1)
eGFR: 95 mL/min/{1.73_m2} (ref >=60)

## 2021-11-01 LAB — LIPID PANEL
Cholesterol: 191 mg/dL (ref <200)
HDL: 54 mg/dL (ref >=50)
LDL Cholesterol (Calc): 122 mg/dL (calc) — ABNORMAL HIGH
Non-HDL Cholesterol (Calc): 137 mg/dL (calc) — ABNORMAL HIGH (ref <130)
Total CHOL/HDL Ratio: 3.5 (calc) (ref <5.0)
Triglycerides: 56 mg/dL (ref <150)

## 2021-11-01 LAB — CBC WITH DIFFERENTIAL/PLATELET
Absolute Monocytes: 230 cells/uL (ref 200–950)
Basophils Absolute: 41 cells/uL (ref 0–200)
Basophils Relative: 0.9 %
Eosinophils Absolute: 101 cells/uL (ref 15–500)
Eosinophils Relative: 2.2 %
HCT: 40.8 % (ref 35.0–45.0)
Hemoglobin: 13.5 g/dL (ref 11.7–15.5)
Lymphs Abs: 2387 cells/uL (ref 850–3900)
MCH: 27.7 pg (ref 27.0–33.0)
MCHC: 33.1 g/dL (ref 32.0–36.0)
MCV: 83.6 fL (ref 80.0–100.0)
MPV: 11.1 fL (ref 7.5–12.5)
Monocytes Relative: 5 %
Neutro Abs: 1840 cells/uL (ref 1500–7800)
Neutrophils Relative %: 40 %
Platelets: 250 10*3/uL (ref 140–400)
RBC: 4.88 10*6/uL (ref 3.80–5.10)
RDW: 13.4 % (ref 11.0–15.0)
Total Lymphocyte: 51.9 %
WBC: 4.6 10*3/uL (ref 3.8–10.8)

## 2021-11-01 LAB — TSH: TSH: 1.88 mIU/L

## 2021-11-01 LAB — VITAMIN D 25 HYDROXY (VIT D DEFICIENCY, FRACTURES): Vit D, 25-Hydroxy: 19 ng/mL — ABNORMAL LOW (ref 30–100)

## 2021-11-01 MED ORDER — VITAMIN D (ERGOCALCIFEROL) 1.25 MG (50000 UNIT) PO CAPS: 50000.0000 [IU] | ORAL_CAPSULE | ORAL | 0 refills | Status: DC

## 2021-11-01 NOTE — Addendum Note (Signed)
Addended bySamuel Bouche on: 11/01/2021 01:01 PM   Modules accepted: Orders

## 2021-11-05 ENCOUNTER — Ambulatory Visit (INDEPENDENT_AMBULATORY_CARE_PROVIDER_SITE_OTHER): Payer: BC Managed Care – PPO | Admitting: Medical-Surgical

## 2021-11-05 DIAGNOSIS — R102 Pelvic and perineal pain: Secondary | ICD-10-CM

## 2021-11-05 LAB — POCT URINALYSIS DIP (CLINITEK)
Bilirubin, UA: NEGATIVE
Glucose, UA: NEGATIVE mg/dL
Ketones, POC UA: NEGATIVE mg/dL
Leukocytes, UA: NEGATIVE
Nitrite, UA: NEGATIVE
POC PROTEIN,UA: NEGATIVE
Spec Grav, UA: <=1.005 — AB (ref 1.010–1.025)
Urobilinogen, UA: 0.2 E.U./dL
pH, UA: 6 (ref 5.0–8.0)

## 2021-11-05 MED ORDER — NITROFURANTOIN MONOHYD MACRO 100 MG PO CAPS: 100.0000 mg | ORAL_CAPSULE | Freq: Two times a day (BID) | ORAL | 0 refills | Status: DC

## 2021-11-05 NOTE — Progress Notes (Signed)
   Subjective:    Patient ID: Kathy Blankenship, female    DOB: August 18, 1970, 51 y.o.   MRN: [id number]  HPI Patient presents today with lower abdomen and low back pain over the past couple days with a positive at home UTI test. She has increased her fluids and started cranberry juice.    Review of Systems     Objective:   Physical Exam        Assessment & Plan:  UA results reviewed with Samuel Bouche, FNP who ordered a urine culture and Macrobid 112m BID x 5 days. Patient was instructed to complete antibiotics and to continue to increase her fluid intake. Patient verbalized understanding of the plan.

## 2021-11-05 NOTE — Progress Notes (Signed)
Agree with documentation as below. ___________________________________________ Clearnce Sorrel, DNP, APRN, FNP-BC Primary Care and Sports Medicine [place]

## 2021-11-07 LAB — URINE CULTURE
MICRO NUMBER:: 13517655
Result:: NO GROWTH
SPECIMEN QUALITY:: ADEQUATE

## 2022-01-10 IMAGING — MG MM DIGITAL SCREENING BILAT W/ TOMO AND CAD
8 series · 9 of 24 positions shown · non-contrast
Comparison: Previous exam(s).

CLINICAL DATA: Screening.

EXAM:
DIGITAL SCREENING BILATERAL MAMMOGRAM WITH TOMOSYNTHESIS AND CAD
TECHNIQUE: Bilateral screening digital craniocaudal and mediolateral oblique
mammograms were obtained. Bilateral screening digital breast
tomosynthesis was performed. The images were evaluated with
computer-aided detection.

[R MLO synth-2D]
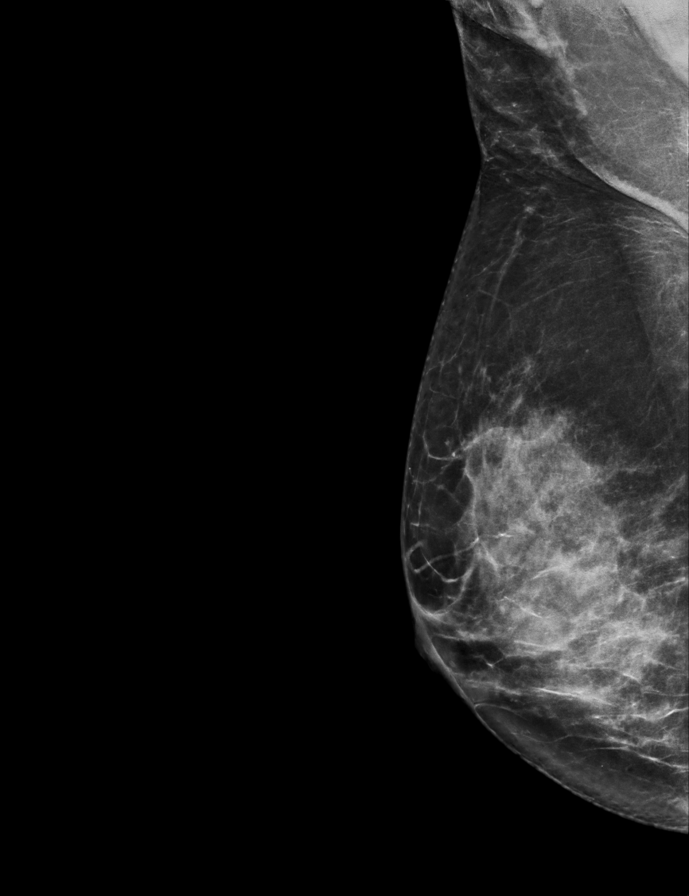

[R CC synth-2D]
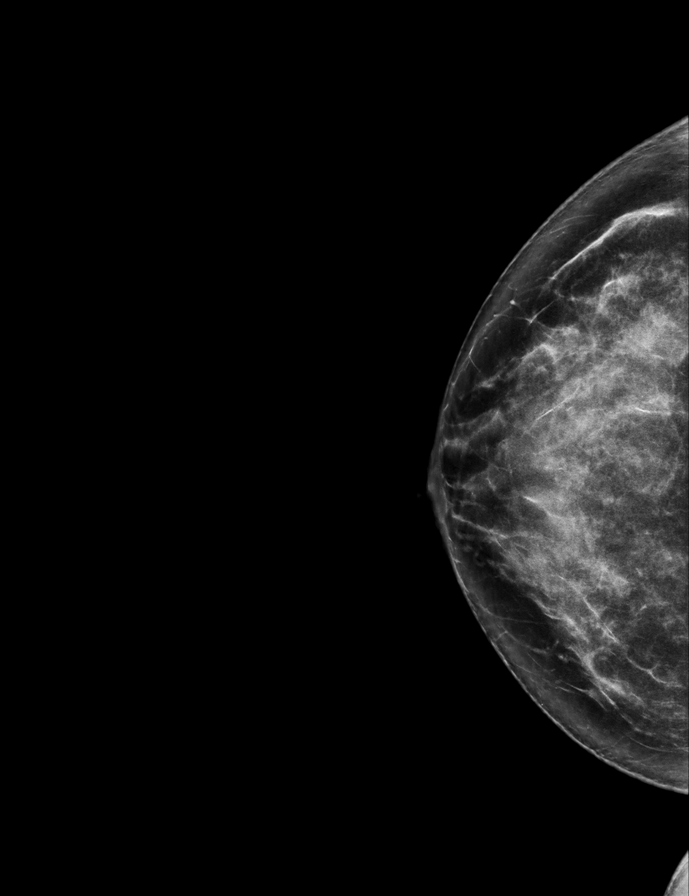

[L MLO synth-2D]
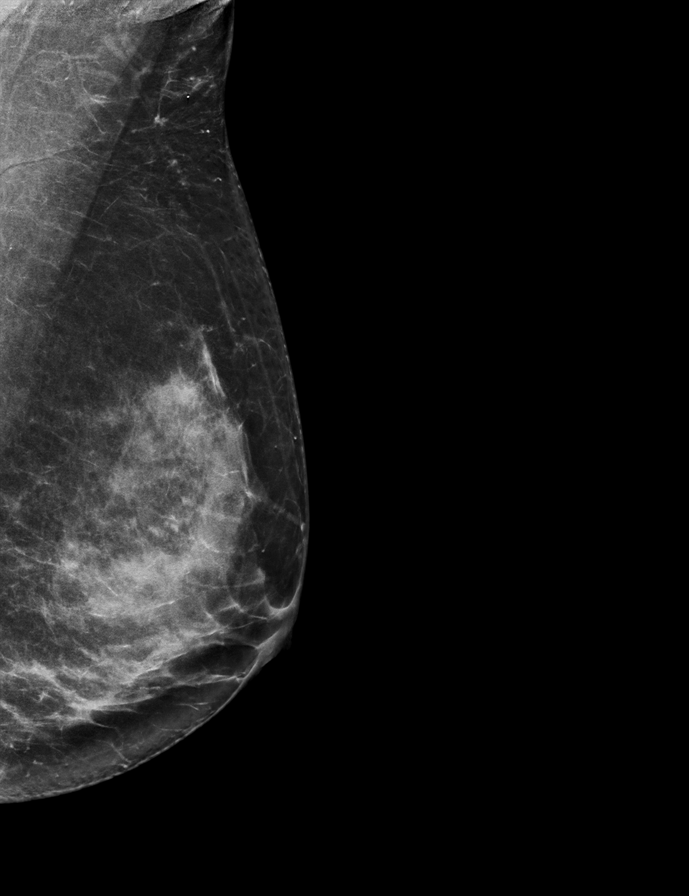

[L CC synth-2D]
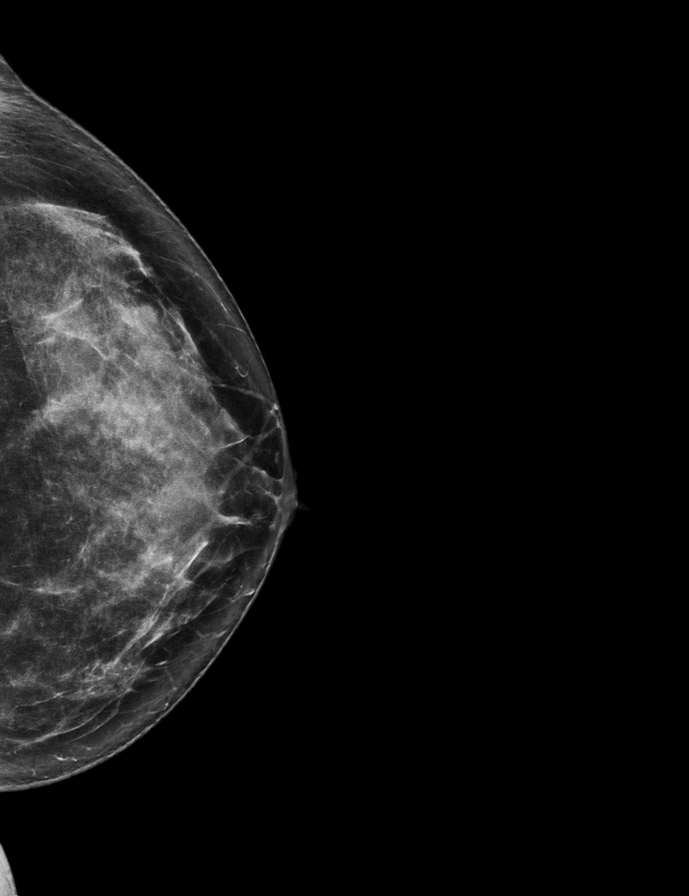

[R MLO tomo · 2 of 72 frames shown]
[frame 24/72]
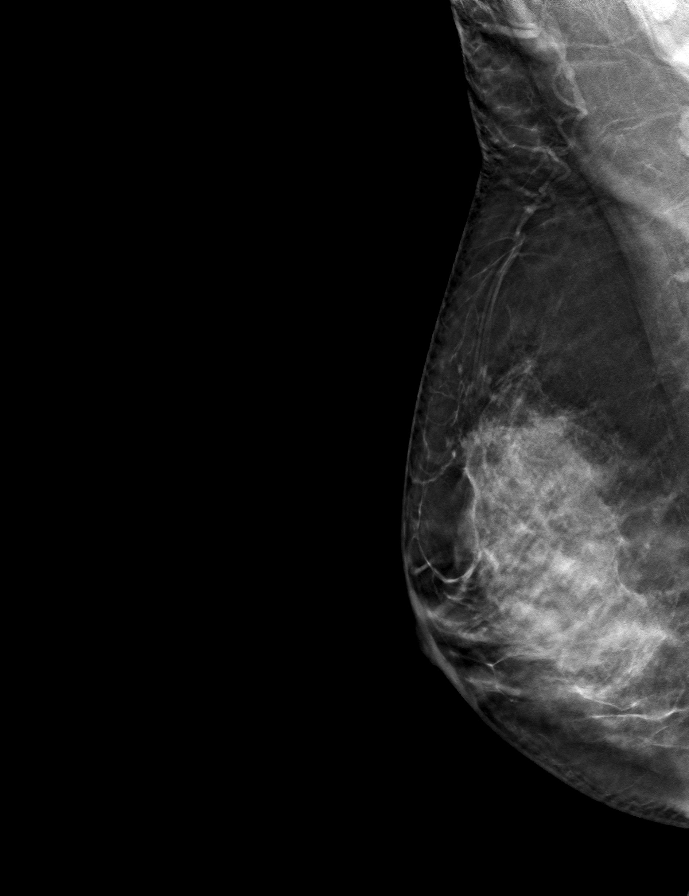
[frame 37/72]
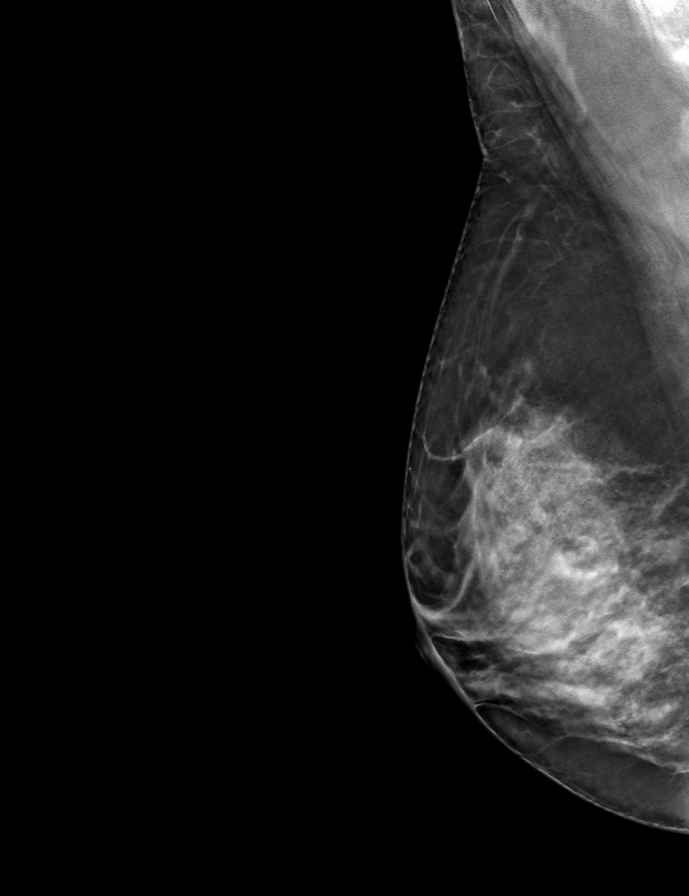

[L MLO tomo · tomo slice 36/71.0]
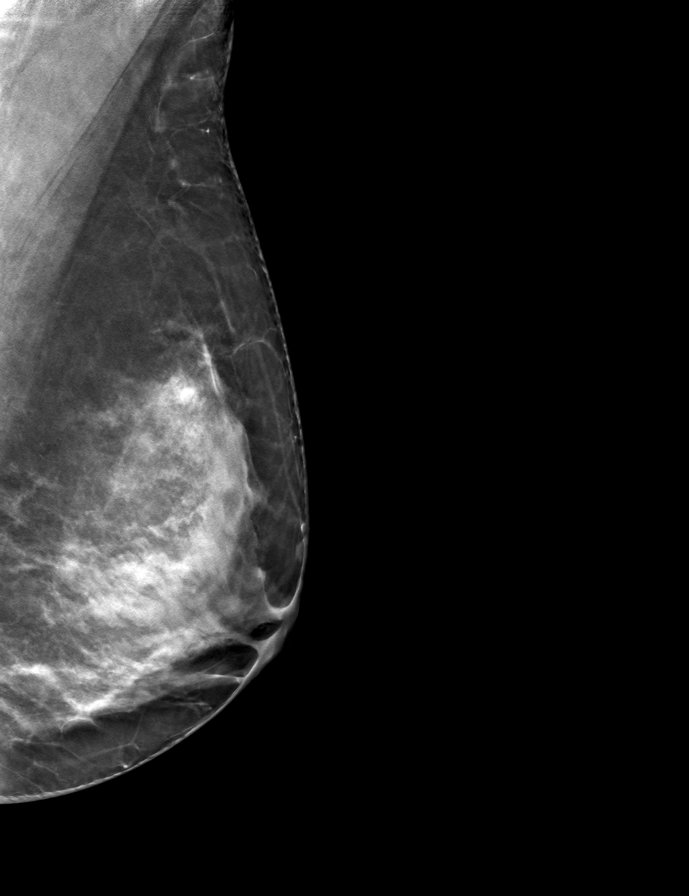

[R CC tomo · tomo slice 35/70.0]
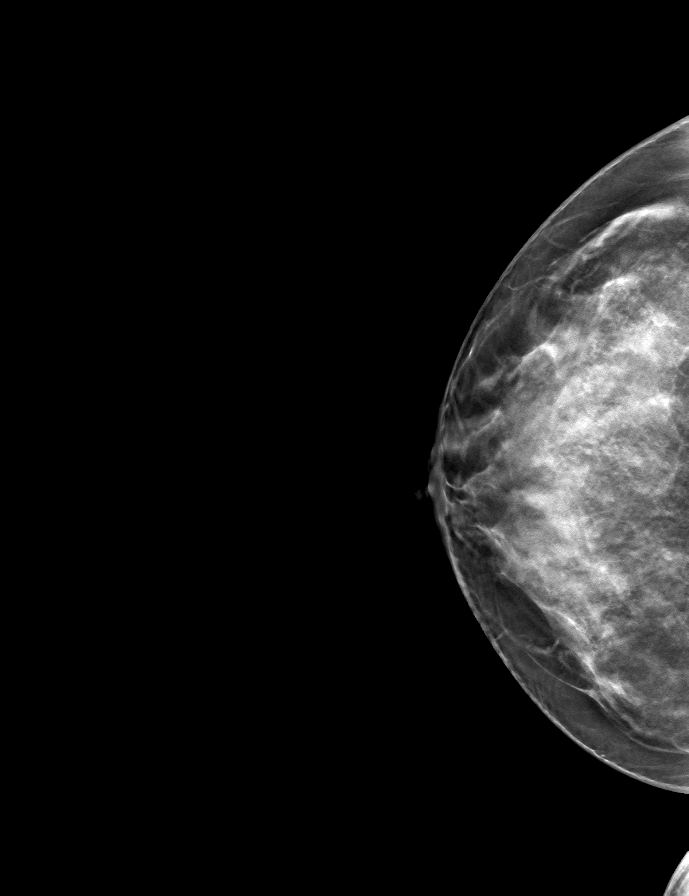

[L CC tomo · tomo slice 35/69.0]
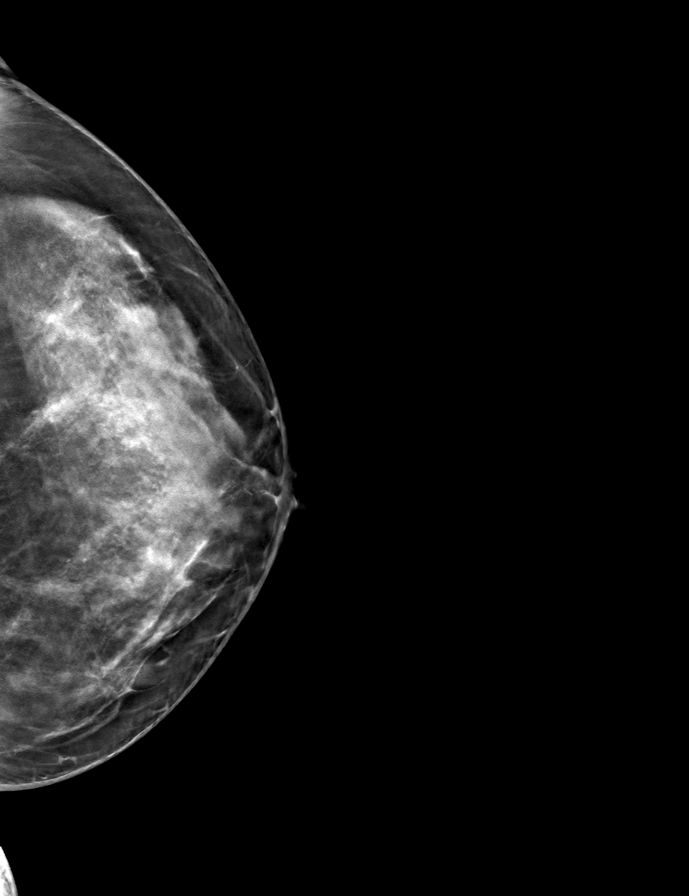

[9 of 24 positions shown; findings below may reference images not displayed]

ACR Breast Density Category d: The breast tissue is extremely dense,
which lowers the sensitivity of mammography
FINDINGS: There are no findings suspicious for malignancy.
IMPRESSION: No mammographic evidence of malignancy. A result letter of this
screening mammogram will be mailed directly to the patient.

RECOMMENDATION:
Screening mammogram in one year. (Code:TA-V-WV9)

BI-RADS CATEGORY  1: Negative.

## 2022-02-11 ENCOUNTER — Ambulatory Visit: Payer: BC Managed Care – PPO | Admitting: Medical-Surgical

## 2022-02-26 ENCOUNTER — Encounter: Payer: Self-pay | Admitting: Medical-Surgical

## 2022-02-26 ENCOUNTER — Ambulatory Visit (INDEPENDENT_AMBULATORY_CARE_PROVIDER_SITE_OTHER): Payer: BC Managed Care – PPO | Admitting: Medical-Surgical

## 2022-02-26 VITALS — BP 103/70 | HR 84 | Resp 20 | Ht 65.0 in | Wt 153.0 lb

## 2022-02-26 DIAGNOSIS — E559 Vitamin D deficiency, unspecified: Secondary | ICD-10-CM | POA: Diagnosis not present

## 2022-02-26 DIAGNOSIS — R319 Hematuria, unspecified: Secondary | ICD-10-CM | POA: Diagnosis not present

## 2022-02-26 DIAGNOSIS — Z23 Encounter for immunization: Secondary | ICD-10-CM

## 2022-02-26 DIAGNOSIS — Z7689 Persons encountering health services in other specified circumstances: Secondary | ICD-10-CM | POA: Diagnosis not present

## 2022-02-26 DIAGNOSIS — E785 Hyperlipidemia, unspecified: Secondary | ICD-10-CM | POA: Diagnosis not present

## 2022-02-26 NOTE — Patient Instructions (Signed)
Low-FODMAP Eating Plan  FODMAP stands for fermentable oligosaccharides, disaccharides, monosaccharides, and polyols. These are sugars that are hard for some people to digest. A low-FODMAP eating plan may help some people who have irritable bowel syndrome (IBS) and certain other bowel (intestinal) diseases to manage their symptoms. This meal plan can be complicated to follow. Work with a diet and nutrition specialist (dietitian) to make a low-FODMAP eating plan that is right for you. A dietitian can help make sure that you get enough nutrition from this diet. What are tips for following this plan? Reading food labels Check labels for hidden FODMAPs such as: High-fructose syrup. Honey. Agave. Natural fruit flavors. Onion or garlic powder. Choose low-FODMAP foods that contain 3-4 grams of fiber per serving. Check food labels for serving sizes. Eat only one serving at a time to make sure FODMAP levels stay low. Shopping Shop with a list of foods that are recommended on this diet and make a meal plan. Meal planning Follow a low-FODMAP eating plan for up to 6 weeks, or as told by your health care provider or dietitian. To follow the eating plan: Eliminate high-FODMAP foods from your diet completely. Choose only low-FODMAP foods to eat. You will do this for 2-6 weeks. Gradually reintroduce high-FODMAP foods into your diet one at a time. Most people should wait a few days before introducing the next new high-FODMAP food into their meal plan. Your dietitian can recommend how quickly you may reintroduce foods. Keep a daily record of what and how much you eat and drink. Make note of any symptoms that you have after eating. Review your daily record with a dietitian regularly to identify which foods you can eat and which foods you should avoid. General tips Drink enough fluid each day to keep your urine pale yellow. Avoid processed foods. These often have added sugar and may be high in FODMAPs. Avoid  most dairy products, whole grains, and sweeteners. Work with a dietitian to make sure you get enough fiber in your diet. Avoid high FODMAP foods at meals to manage symptoms. Recommended foods Fruits Bananas, oranges, tangerines, lemons, limes, blueberries, raspberries, strawberries, grapes, cantaloupe, honeydew melon, kiwi, papaya, passion fruit, and pineapple. Limited amounts of dried cranberries, banana chips, and shredded coconut. Vegetables Eggplant, zucchini, cucumber, peppers, green beans, bean sprouts, lettuce, arugula, kale, Swiss chard, spinach, collard greens, bok choy, summer squash, potato, and tomato. Limited amounts of corn, carrot, and sweet potato. Green parts of scallions. Grains Gluten-free grains, such as rice, oats, buckwheat, quinoa, corn, polenta, and millet. Gluten-free pasta, bread, or cereal. Rice noodles. Corn tortillas. Meats and other proteins Unseasoned beef, pork, poultry, or fish. Eggs. Bacon. Tofu (firm) and tempeh. Limited amounts of nuts and seeds, such as almonds, walnuts, brazil nuts, pecans, peanuts, nut butters, pumpkin seeds, chia seeds, and sunflower seeds. Dairy Lactose-free milk, yogurt, and kefir. Lactose-free cottage cheese and ice cream. Non-dairy milks, such as almond, coconut, hemp, and rice milk. Non-dairy yogurt. Limited amounts of goat cheese, brie, mozzarella, parmesan, swiss, and other hard cheeses. Fats and oils Butter-free spreads. Vegetable oils, such as olive, canola, and sunflower oil. Seasoning and other foods Artificial sweeteners with names that do not end in "ol," such as aspartame, saccharine, and stevia. Maple syrup, white table sugar, raw sugar, brown sugar, and molasses. Mayonnaise, soy sauce, and tamari. Fresh basil, coriander, parsley, rosemary, and thyme. Beverages Water and mineral water. Sugar-sweetened soft drinks. Small amounts of orange juice or cranberry juice. Black and green tea. Most dry wines.   Coffee. The items listed  above may not be a complete list of foods and beverages you can eat. Contact a dietitian for more information. Foods to avoid Fruits Fresh, dried, and juiced forms of apple, pear, watermelon, peach, plum, cherries, apricots, blackberries, boysenberries, figs, nectarines, and mango. Avocado. Vegetables Chicory root, artichoke, asparagus, cabbage, snow peas, Brussels sprouts, broccoli, sugar snap peas, mushrooms, celery, and cauliflower. Onions, garlic, leeks, and the white part of scallions. Grains Wheat, including kamut, durum, and semolina. Barley and bulgur. Couscous. Wheat-based cereals. Wheat noodles, bread, crackers, and pastries. Meats and other proteins Fried or fatty meat. Sausage. Cashews and pistachios. Soybeans, baked beans, black beans, chickpeas, kidney beans, fava beans, navy beans, lentils, black-eyed peas, and split peas. Dairy Milk, yogurt, ice cream, and soft cheese. Cream and sour cream. Milk-based sauces. Custard. Buttermilk. Soy milk. Seasoning and other foods Any sugar-free gum or candy. Foods that contain artificial sweeteners such as sorbitol, mannitol, isomalt, or xylitol. Foods that contain honey, high-fructose corn syrup, or agave. Bouillon, vegetable stock, beef stock, and chicken stock. Garlic and onion powder. Condiments made with onion, such as hummus, chutney, pickles, relish, salad dressing, and salsa. Tomato paste. Beverages Chicory-based drinks. Coffee substitutes. Chamomile tea. Fennel tea. Sweet or fortified wines such as port or sherry. Diet soft drinks made with isomalt, mannitol, maltitol, sorbitol, or xylitol. Apple, pear, and mango juice. Juices with high-fructose corn syrup. The items listed above may not be a complete list of foods and beverages you should avoid. Contact a dietitian for more information. Summary FODMAP stands for fermentable oligosaccharides, disaccharides, monosaccharides, and polyols. These are sugars that are hard for some people to  digest. A low-FODMAP eating plan is a short-term diet that helps to ease symptoms of certain bowel diseases. The eating plan usually lasts up to 6 weeks. After that, high-FODMAP foods are reintroduced gradually and one at a time. This can help you find out which foods may be causing symptoms. A low-FODMAP eating plan can be complicated. It is best to work with a dietitian who has experience with this type of plan. This information is not intended to replace advice given to you by your health care provider. Make sure you discuss any questions you have with your health care provider. Document Revised: 09/30/2019 Document Reviewed: 09/30/2019 Elsevier Patient Education  2023 [employer] Inc[employer]  

## 2022-02-26 NOTE — Progress Notes (Signed)
Established Patient Office Visit  Subjective   Patient ID: Kathy Blankenship, female   DOB: [birth year] Age: 51 y.o. MRN: [id number]   Chief Complaint  Patient presents with   Follow-up    HPI Pleasant 51 year old female presenting today for the following:  HLD: not currently taking any medications for management of cholesterol. At the last check in June, her LDL was 122 but her ASCVD risk score was low. Reports that she eats very healthy for the most part and is not sure where the excessive cholesterol intake may be coming from.   Vitamin D deficiency: has been taking high dose vitamin D replacement as prescribed and reports having one pill left. Has started a Centrum gummy MVI which contains vitamin D.    Objective:    Vitals:   02/26/22 1455  BP: 103/70  Pulse: 84  Resp: 20  Height: '5\' 5"'$  (1.651 m)  Weight: 153 lb (69.4 kg)  SpO2: 97%  BMI (Calculated): 25.46   Physical Exam Vitals reviewed.  Constitutional:      General: She is not in acute distress.    Appearance: Normal appearance. She is not ill-appearing.  HENT:     Head: Normocephalic and atraumatic.  Cardiovascular:     Rate and Rhythm: Normal rate and regular rhythm.     Pulses: Normal pulses.     Heart sounds: Normal heart sounds.  Pulmonary:     Effort: Pulmonary effort is normal. No respiratory distress.     Breath sounds: Normal breath sounds. No wheezing, rhonchi or rales.  Skin:    General: Skin is warm and dry.  Neurological:     Mental Status: She is alert and oriented to person, place, and time.  Psychiatric:        Mood and Affect: Mood normal.        Behavior: Behavior normal.        Thought Content: Thought content normal.        Judgment: Judgment normal.   No results found for this or any previous visit (from the past 24 hour(s)).     The 10-year ASCVD risk score (Arnett DK, et al., 2019) is: 0.7%   Values used to calculate the score:     Age: 29 years     Sex: Female     Is  Non-Hispanic African American: Yes     Diabetic: No     Tobacco smoker: No     Systolic Blood Pressure: 017 mmHg     Is BP treated: No     HDL Cholesterol: 54 mg/dL     Total Cholesterol: 191 mg/dL   Assessment & Plan:   1. Encounter to establish care Discussed with patient that she has not yet completed a transfer of care since her prior PCP left and recommended that she do so at her convenience. After discussion of providers that are accepting new patients, she verbalized that she would like to establish with me. PCP changed in the system. Reviewed available information and discussed care concerns with patient.   2. Hyperlipidemia, unspecified hyperlipidemia type Reviewed recommendations for cholesterol goals. Discussed hidden cholesterol in foods that would not normally be considered as contributors. Advised to monitor nutrition labels with close attention to saturated and transfat content. Recommend working to incorporate healthy fats in place of unhealthy ones.   3. Vitamin D deficiency Checking Vitamin D levels today. Continue Centrum gummies.   4. Need for influenza vaccination Flu vaccine given in office today.  -  Flu Vaccine QUAD 6+ mos PF IM (Fluarix Quad PF)  Return in about 8 months (around 11/01/2022) for annual physical exam or sooner if needed. ___________________________________________ Clearnce Sorrel, DNP, APRN, FNP-BC [facility]

## 2022-02-27 LAB — VITAMIN D 25 HYDROXY (VIT D DEFICIENCY, FRACTURES): Vit D, 25-Hydroxy: 50 ng/mL (ref 30–100)

## 2022-10-01 IMAGING — DX DG LUMBAR SPINE BEND(FLEX/EXT) ONLY 2-3 V
3 series · 3 of 3 positions shown · non-contrast
Comparison: Lumbar radiographs 06/16/2019.

CLINICAL DATA: L5-S1 spondylolisthesis. Clinical concern for
dynamic instability.

EXAM:
LUMBAR SPINE FLEX AND EXTEND ONLY - 2-3 VIEW

[l-spine lat]
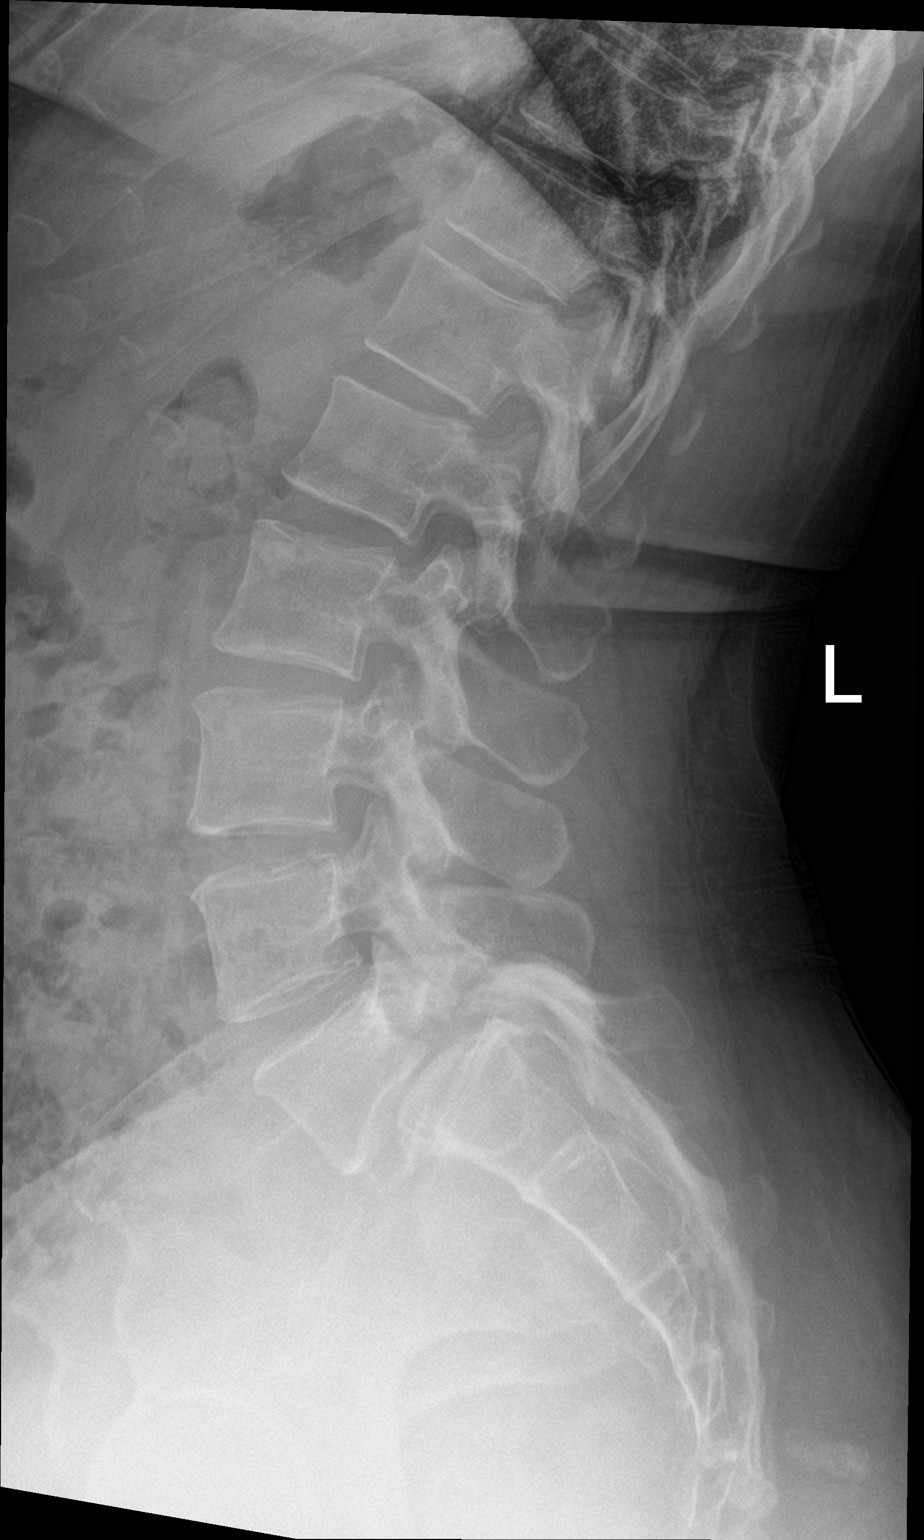

[l-spine flex]
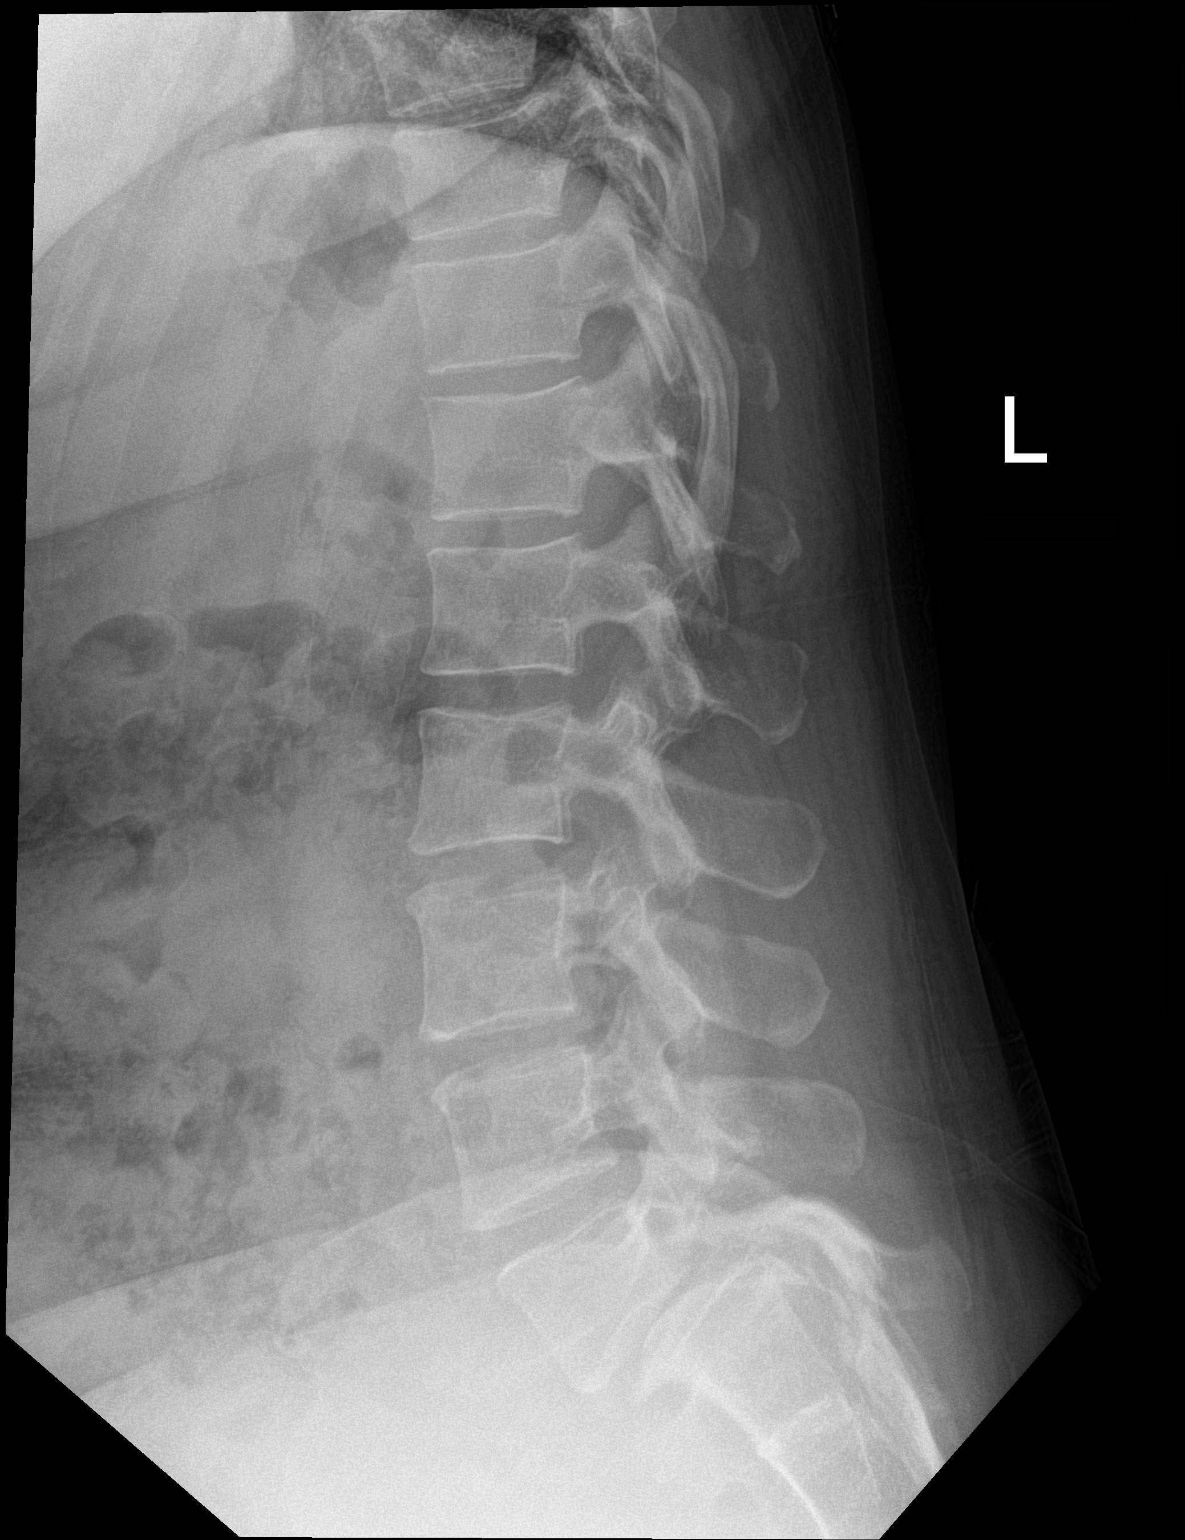

[l-spine ext]
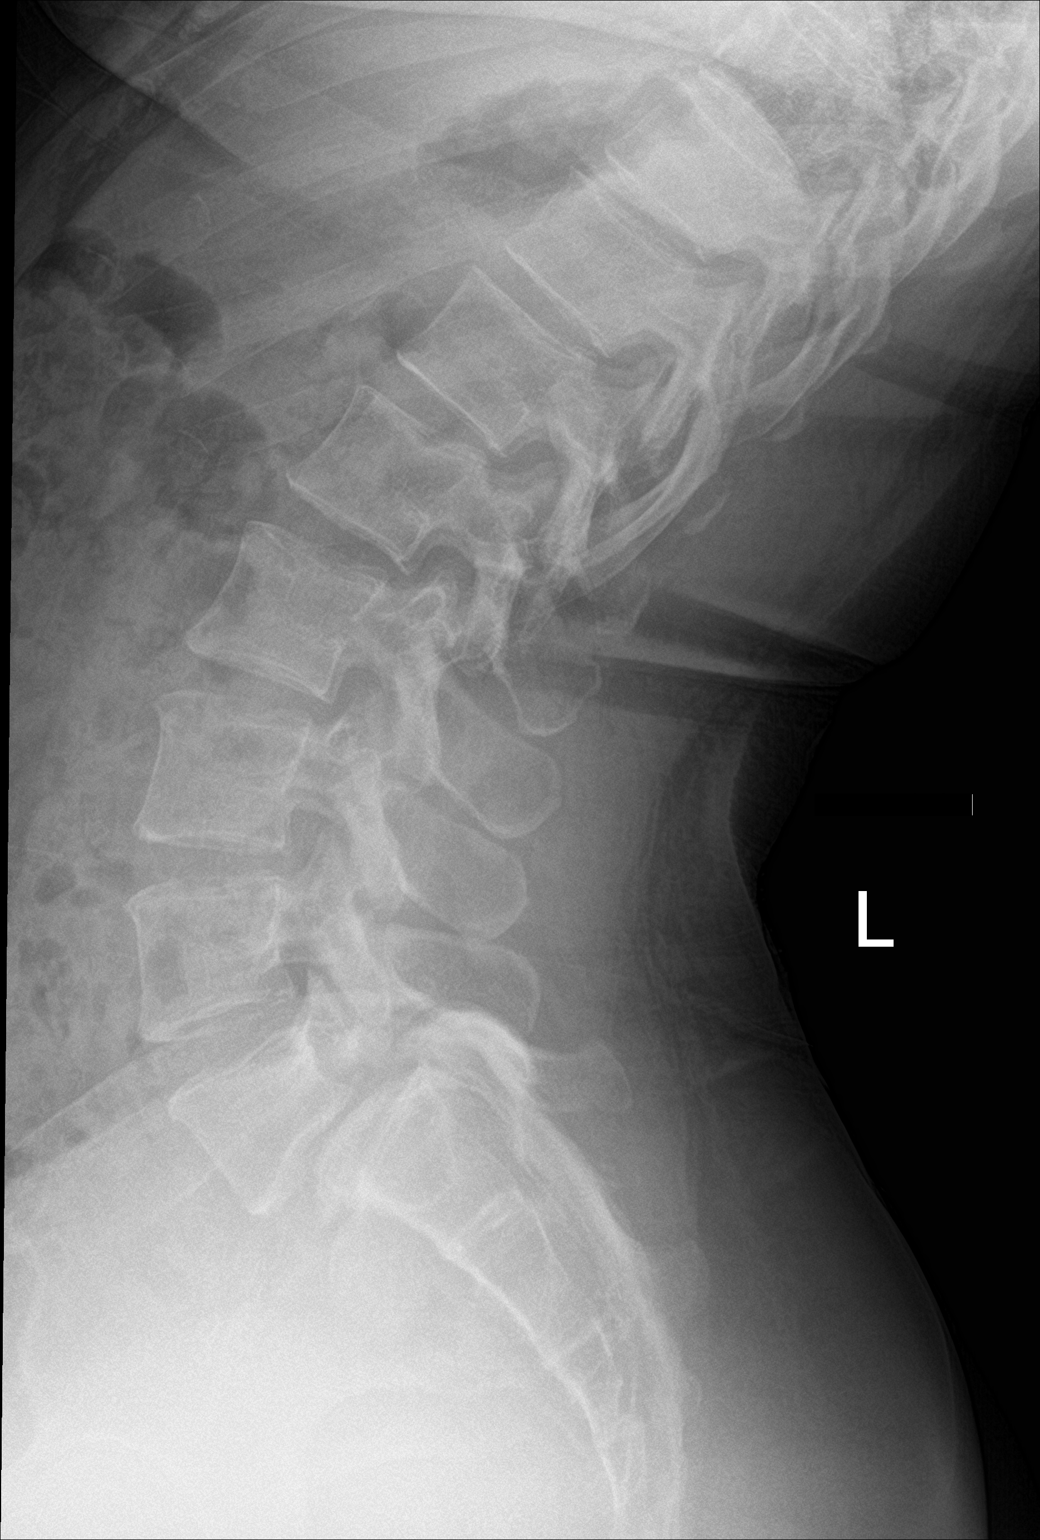

[3 of 3 positions shown; findings below may reference images not displayed]

FINDINGS: Lateral neutral, flexion and extension views only obtained.
Bilateral L5 pars interarticularis defects. There is 16 mm
anterolisthesis of L5 on S1 that is unchanged on flexion and
extension. Lumbar alignment is otherwise normal. Degenerative disc
disease at L5-S1. Vertebral body heights are normal.
IMPRESSION: 1. Bilateral L5 pars interarticularis defects with 16 mm
anterolisthesis of L5 on S1, unchanged on flexion and extension.
2. Degenerative disc disease at L5-S1.

## 2022-10-28 ENCOUNTER — Encounter: Payer: BC Managed Care – PPO | Admitting: Medical-Surgical

## 2022-11-04 ENCOUNTER — Encounter: Payer: Self-pay | Admitting: Medical-Surgical

## 2022-11-04 ENCOUNTER — Other Ambulatory Visit (HOSPITAL_COMMUNITY)
Admission: RE | Admit: 2022-11-04 | Discharge: 2022-11-04 | Disposition: A | Discharge status: Home / Self Care | Payer: BC Managed Care – PPO | Source: Ambulatory Visit | Attending: Medical-Surgical | Admitting: Medical-Surgical

## 2022-11-04 ENCOUNTER — Ambulatory Visit (INDEPENDENT_AMBULATORY_CARE_PROVIDER_SITE_OTHER): Payer: BC Managed Care – PPO | Admitting: Medical-Surgical

## 2022-11-04 VITALS — BP 122/90 | HR 93 | Resp 99 | Ht 65.0 in | Wt 158.0 lb

## 2022-11-04 DIAGNOSIS — Z13220 Encounter for screening for lipoid disorders: Secondary | ICD-10-CM

## 2022-11-04 DIAGNOSIS — R8290 Unspecified abnormal findings in urine: Secondary | ICD-10-CM

## 2022-11-04 DIAGNOSIS — Z124 Encounter for screening for malignant neoplasm of cervix: Secondary | ICD-10-CM | POA: Insufficient documentation

## 2022-11-04 DIAGNOSIS — E559 Vitamin D deficiency, unspecified: Secondary | ICD-10-CM

## 2022-11-04 DIAGNOSIS — R140 Abdominal distension (gaseous): Secondary | ICD-10-CM

## 2022-11-04 DIAGNOSIS — Z114 Encounter for screening for human immunodeficiency virus [HIV]: Secondary | ICD-10-CM | POA: Diagnosis not present

## 2022-11-04 DIAGNOSIS — Z1159 Encounter for screening for other viral diseases: Secondary | ICD-10-CM

## 2022-11-04 DIAGNOSIS — Z0000 Encounter for general adult medical examination without abnormal findings: Secondary | ICD-10-CM

## 2022-11-04 LAB — CBC WITH DIFFERENTIAL/PLATELET
Lymphs Abs: 2761 cells/uL (ref 850–3900)
MCH: 28.4 pg (ref 27.0–33.0)
MCV: 84.5 fL (ref 80.0–100.0)
Neutro Abs: 2007 cells/uL (ref 1500–7800)
RBC: 4.58 10*6/uL (ref 3.80–5.10)
WBC: 5.2 10*3/uL (ref 3.8–10.8)

## 2022-11-04 NOTE — Progress Notes (Signed)
Complete physical exam  Patient: Kathy Blankenship   DOB: 08-Jul-1970   52 y.o. Female  MRN: [id number]  Subjective:    Chief Complaint  Patient presents with   Annual Exam   Gynecologic Exam    Kathy Blankenship is a 52 y.o. female who presents today for a complete physical exam. She reports consuming a  limited dairy  diet.  Currently doing regular stretching with plans to increase activity as tolerated.  She generally feels well. She reports sleeping well. She does not have additional problems to discuss today.    Most recent fall risk assessment:    11/04/2022    2:49 PM  Fall Risk   Falls in the past year? 0  Number falls in past yr: 0  Injury with Fall? 0  Risk for fall due to : No Fall Risks  Follow up Falls evaluation completed     Most recent depression screenings:    11/04/2022    2:49 PM 10/31/2021    9:29 AM  PHQ 2/9 Scores  PHQ - 2 Score 0 0    Vision:Within last year, Dental: Current dental problems and Receives regular dental care, and STD: The patient reports a past history of: herpes    Patient Care Team: Christen Butter, NP as PCP - General (Nurse Practitioner)   Outpatient Medications Prior to Visit  Medication Sig   Vitamin D, Ergocalciferol, (DRISDOL) 1.25 MG (50000 UNIT) CAPS capsule Take 1 capsule (50,000 Units total) by mouth every 7 (seven) days.   No facility-administered medications prior to visit.    Review of Systems  Constitutional:  Negative for chills, fever, malaise/fatigue and weight loss.  HENT:  Negative for congestion, ear pain, hearing loss, sinus pain and sore throat.   Eyes:  Negative for blurred vision, photophobia and pain.  Respiratory:  Negative for cough, shortness of breath and wheezing.   Cardiovascular:  Negative for chest pain, palpitations and leg swelling.  Gastrointestinal:  Negative for abdominal pain, blood in stool, constipation, diarrhea, heartburn, melena, nausea and vomiting.  Genitourinary:  Negative for dysuria,  frequency and urgency.  Musculoskeletal:  Negative for falls and neck pain.  Skin:  Negative for itching and rash.  Neurological:  Negative for dizziness, weakness and headaches.  Endo/Heme/Allergies:  Negative for polydipsia. Does not bruise/bleed easily.  Psychiatric/Behavioral:  Negative for depression, substance abuse and suicidal ideas. The patient is not nervous/anxious and does not have insomnia.      Objective:    BP (!) 122/90 (BP Location: Left Arm, Patient Position: Sitting, Cuff Size: Normal)   Pulse 93   Resp (!) 99   Ht 5\' 5"  (1.651 m)   Wt 158 lb (71.7 kg)   BMI 26.29 kg/m    Physical Exam Exam conducted with a chaperone present.  Constitutional:      General: She is not in acute distress.    Appearance: Normal appearance. She is not ill-appearing.  HENT:     Head: Normocephalic and atraumatic.     Right Ear: Tympanic membrane, ear canal and external ear normal. There is no impacted cerumen.     Left Ear: Tympanic membrane, ear canal and external ear normal. There is no impacted cerumen.     Nose: Nose normal. No congestion or rhinorrhea.     Mouth/Throat:     Mouth: Mucous membranes are moist.     Pharynx: No oropharyngeal exudate or posterior oropharyngeal erythema.  Eyes:     General: No scleral icterus.  Right eye: No discharge.        Left eye: No discharge.     Extraocular Movements: Extraocular movements intact.     Conjunctiva/sclera: Conjunctivae normal.     Pupils: Pupils are equal, round, and reactive to light.  Neck:     Thyroid: No thyromegaly.     Vascular: No carotid bruit or JVD.     Trachea: Trachea normal.  Cardiovascular:     Rate and Rhythm: Normal rate and regular rhythm.     Pulses: Normal pulses.     Heart sounds: Normal heart sounds. No murmur heard.    No friction rub. No gallop.  Pulmonary:     Effort: Pulmonary effort is normal. No respiratory distress.     Breath sounds: Normal breath sounds. No wheezing.  Abdominal:      General: Bowel sounds are normal. There is no distension.     Palpations: Abdomen is soft.     Tenderness: There is no abdominal tenderness. There is no guarding.  Genitourinary:    Exam position: Lithotomy position.     Labia:        Right: No rash, tenderness, lesion or injury.        Left: No rash, tenderness, lesion or injury.      Vagina: Normal.     Cervix: Normal.     Uterus: Normal.      Adnexa: Right adnexa normal and left adnexa normal.  Musculoskeletal:        General: Normal range of motion.     Cervical back: Normal range of motion and neck supple.  Lymphadenopathy:     Cervical: No cervical adenopathy.  Skin:    General: Skin is warm and dry.  Neurological:     Mental Status: She is alert and oriented to person, place, and time.     Cranial Nerves: No cranial nerve deficit.  Psychiatric:        Mood and Affect: Mood normal.        Behavior: Behavior normal.        Thought Content: Thought content normal.        Judgment: Judgment normal.    No results found for any visits on 11/04/22.     Assessment & Plan:    Routine Health Maintenance and Physical Exam  Immunization History  Administered Date(s) Administered   Influenza Whole 03/08/2008   Influenza,inj,Quad PF,6+ Mos 04/23/2016, 03/03/2017, 03/14/2018, 02/06/2019, 02/26/2022   Influenza,inj,quad, With Preservative 02/19/2014   Influenza-Unspecified 02/19/2014, 02/25/2020   Moderna Sars-Covid-2 Vaccination 08/19/2019, 09/16/2019, 03/27/2020   Tdap 12/20/2013    Health Maintenance  Topic Date Due   Hepatitis C Screening  Never done   COVID-19 Vaccine (4 - 2023-24 season) 01/25/2022   PAP SMEAR-Modifier  10/09/2022   HIV Screening  11/04/2023 (Originally 02/05/1986)   Fecal DNA (Cologuard)  11/09/2022   INFLUENZA VACCINE  12/26/2022   DTaP/Tdap/Td (2 - Td or Tdap) 12/21/2023   HPV VACCINES  Aged Out    Discussed health benefits of physical activity, and encouraged her to engage in regular  exercise appropriate for her age and condition.  1. Annual physical exam Checking labs as below.  Wellness information father with AVS.  Up-to-date on preventative care. - CBC with Differential/Platelet - COMPLETE METABOLIC PANEL WITH GFR - Lipid panel  2. Lipid screening Checking lipids today. - Lipid panel  3. Vitamin D deficiency Checking vitamin D. - VITAMIN D 25 Hydroxy (Vit-D Deficiency, Fractures)  4. Encounter for screening for  HIV Discussed screening recommendations.  She is agreeable so adding to blood work today. - HIV Antibody (routine testing w rflx)  5. Need for hepatitis C screening test Discussed screening recommendations.  She is agreeable so adding to blood work today. - Hepatitis C Antibody  6. Abdominal bloating Does have an issue with abdominal bloating and has a family history of celiac disease.  Interested in being tested today.  Ordering celiac disease panel. - Celiac Disease Panel  7. Abnormal urine odor Has had some abnormal urinary odor that smells as if there is blood in her urine.  This used to be frequent but has become more intermittent.  Is interested in having a urinalysis today but is unable to void our appointment.  Order placed for later collection at her convenience.  8. Cervical cancer screening Pap smear with HPV cotesting completed today. - Cytology - PAP  Return in about 1 year (around 11/04/2023) for annual physical exam.   Christen Butter, NP

## 2022-11-05 LAB — CBC WITH DIFFERENTIAL/PLATELET
Absolute Monocytes: 270 cells/uL (ref 200–950)
Basophils Absolute: 62 cells/uL (ref 0–200)
Basophils Relative: 1.2 %
Eosinophils Absolute: 99 cells/uL (ref 15–500)
Eosinophils Relative: 1.9 %
HCT: 38.7 % (ref 35.0–45.0)
Hemoglobin: 13 g/dL (ref 11.7–15.5)
MCHC: 33.6 g/dL (ref 32.0–36.0)
MPV: 11.1 fL (ref 7.5–12.5)
Monocytes Relative: 5.2 %
Neutrophils Relative %: 38.6 %
Platelets: 249 10*3/uL (ref 140–400)
RDW: 13.3 % (ref 11.0–15.0)
Total Lymphocyte: 53.1 %

## 2022-11-05 LAB — COMPLETE METABOLIC PANEL WITH GFR
AG Ratio: 1.6 (calc) (ref 1.0–2.5)
ALT: 12 U/L (ref 6–29)
AST: 14 U/L (ref 10–35)
Albumin: 4.4 g/dL (ref 3.6–5.1)
Alkaline phosphatase (APISO): 62 U/L (ref 31–125)
BUN: 7 mg/dL (ref 7–25)
CO2: 27 mmol/L (ref 20–32)
Calcium: 9.8 mg/dL (ref 8.6–10.2)
Chloride: 107 mmol/L (ref 98–110)
Creat: 0.71 mg/dL (ref 0.50–0.99)
Globulin: 2.7 g/dL (calc) (ref 1.9–3.7)
Glucose, Bld: 78 mg/dL (ref 65–99)
Potassium: 3.9 mmol/L (ref 3.5–5.3)
Sodium: 144 mmol/L (ref 135–146)
Total Bilirubin: 0.7 mg/dL (ref 0.2–1.2)
Total Protein: 7.1 g/dL (ref 6.1–8.1)
eGFR: 104 mL/min/{1.73_m2} (ref >=60)

## 2022-11-05 LAB — CELIAC DISEASE PANEL
(tTG) Ab, IgA: <1 U/mL
(tTG) Ab, IgG: <1 U/mL
Gliadin IgA: 1.3 U/mL
Gliadin IgG: 4.2 U/mL
Immunoglobulin A: 140 mg/dL (ref 47–310)

## 2022-11-05 LAB — LIPID PANEL
Cholesterol: 163 mg/dL (ref <200)
HDL: 56 mg/dL (ref >=50)
LDL Cholesterol (Calc): 93 mg/dL (calc)
Non-HDL Cholesterol (Calc): 107 mg/dL (calc) (ref <130)
Total CHOL/HDL Ratio: 2.9 (calc) (ref <5.0)
Triglycerides: 59 mg/dL (ref <150)

## 2022-11-05 LAB — HEPATITIS C ANTIBODY: Hepatitis C Ab: NONREACTIVE

## 2022-11-05 LAB — HIV ANTIBODY (ROUTINE TESTING W REFLEX): HIV 1&2 Ab, 4th Generation: NONREACTIVE

## 2022-11-05 LAB — VITAMIN D 25 HYDROXY (VIT D DEFICIENCY, FRACTURES): Vit D, 25-Hydroxy: 21 ng/mL — ABNORMAL LOW (ref 30–100)

## 2022-11-07 LAB — CYTOLOGY - PAP
Adequacy: ABSENT
Comment: NEGATIVE
Diagnosis: NEGATIVE
High risk HPV: NEGATIVE

## 2023-11-06 LAB — COLOGUARD
COLOGUARD: NEGATIVE
Cologuard: NEGATIVE

## 2023-12-26 ENCOUNTER — Ambulatory Visit (INDEPENDENT_AMBULATORY_CARE_PROVIDER_SITE_OTHER): Admitting: Medical-Surgical

## 2023-12-26 ENCOUNTER — Encounter: Payer: Self-pay | Admitting: Medical-Surgical

## 2023-12-26 VITALS — BP 111/73 | HR 82 | Resp 20 | Ht 65.0 in | Wt 161.0 lb

## 2023-12-26 DIAGNOSIS — E559 Vitamin D deficiency, unspecified: Secondary | ICD-10-CM

## 2023-12-26 DIAGNOSIS — Z1231 Encounter for screening mammogram for malignant neoplasm of breast: Secondary | ICD-10-CM | POA: Diagnosis not present

## 2023-12-26 DIAGNOSIS — Z0000 Encounter for general adult medical examination without abnormal findings: Secondary | ICD-10-CM

## 2023-12-26 DIAGNOSIS — E785 Hyperlipidemia, unspecified: Secondary | ICD-10-CM

## 2023-12-26 NOTE — Patient Instructions (Signed)
 Preventive Care 16-51 Years Old, Female  Preventive care refers to lifestyle choices and visits with your health care provider that can promote health and wellness. Preventive care visits are also called wellness exams.  What can I expect for my preventive care visit?  Counseling  Your health care provider may ask you questions about your:  Medical history, including:  Past medical problems.  Family medical history.  Pregnancy history.  Current health, including:  Menstrual cycle.  Method of birth control.  Emotional well-being.  Home life and relationship well-being.  Sexual activity and sexual health.  Lifestyle, including:  Alcohol, nicotine or tobacco, and drug use.  Access to firearms.  Diet, exercise, and sleep habits.  Work and work Astronomer.  Sunscreen use.  Safety issues such as seatbelt and bike helmet use.  Physical exam  Your health care provider will check your:  Height and weight. These may be used to calculate your BMI (body mass index). BMI is a measurement that tells if you are at a healthy weight.  Waist circumference. This measures the distance around your waistline. This measurement also tells if you are at a healthy weight and may help predict your risk of certain diseases, such as type 2 diabetes and high blood pressure.  Heart rate and blood pressure.  Body temperature.  Skin for abnormal spots.  What immunizations do I need?    Vaccines are usually given at various ages, according to a schedule. Your health care provider will recommend vaccines for you based on your age, medical history, and lifestyle or other factors, such as travel or where you work.  What tests do I need?  Screening  Your health care provider may recommend screening tests for certain conditions. This may include:  Lipid and cholesterol levels.  Diabetes screening. This is done by checking your blood sugar (glucose) after you have not eaten for a while (fasting).  Pelvic exam and Pap test.  Hepatitis B test.  Hepatitis C  test.  HIV (human immunodeficiency virus) test.  STI (sexually transmitted infection) testing, if you are at risk.  Lung cancer screening.  Colorectal cancer screening.  Mammogram. Talk with your health care provider about when you should start having regular mammograms. This may depend on whether you have a family history of breast cancer.  BRCA-related cancer screening. This may be done if you have a family history of breast, ovarian, tubal, or peritoneal cancers.  Bone density scan. This is done to screen for osteoporosis.  Talk with your health care provider about your test results, treatment options, and if necessary, the need for more tests.  Follow these instructions at home:  Eating and drinking    Eat a diet that includes fresh fruits and vegetables, whole grains, lean protein, and low-fat dairy products.  Take vitamin and mineral supplements as recommended by your health care provider.  Do not drink alcohol if:  Your health care provider tells you not to drink.  You are pregnant, may be pregnant, or are planning to become pregnant.  If you drink alcohol:  Limit how much you have to 0-1 drink a day.  Know how much alcohol is in your drink. In the [place] one drink equals one 12 oz bottle of beer (355 mL), one 5 oz glass of wine (148 mL), or one 1 oz glass of hard liquor (44 mL).  Lifestyle  Brush your teeth every morning and night with fluoride toothpaste. Floss one time each day.  Exercise for at least  30 minutes 5 or more days each week.  Do not use any products that contain nicotine or tobacco. These products include cigarettes, chewing tobacco, and vaping devices, such as e-cigarettes. If you need help quitting, ask your health care provider.  Do not use drugs.  If you are sexually active, practice safe sex. Use a condom or other form of protection to prevent STIs.  If you do not wish to become pregnant, use a form of birth control. If you plan to become pregnant, see your health care provider for a  prepregnancy visit.  Take aspirin only as told by your health care provider. Make sure that you understand how much to take and what form to take. Work with your health care provider to find out whether it is safe and beneficial for you to take aspirin daily.  Find healthy ways to manage stress, such as:  Meditation, yoga, or listening to music.  Journaling.  Talking to a trusted person.  Spending time with friends and family.  Minimize exposure to UV radiation to reduce your risk of skin cancer.  Safety  Always wear your seat belt while driving or riding in a vehicle.  Do not drive:  If you have been drinking alcohol. Do not ride with someone who has been drinking.  When you are tired or distracted.  While texting.  If you have been using any mind-altering substances or drugs.  Wear a helmet and other protective equipment during sports activities.  If you have firearms in your house, make sure you follow all gun safety procedures.  Seek help if you have been physically or sexually abused.  What's next?  Visit your health care provider once a year for an annual wellness visit.  Ask your health care provider how often you should have your eyes and teeth checked.  Stay up to date on all vaccines.  This information is not intended to replace advice given to you by your health care provider. Make sure you discuss any questions you have with your health care provider.  Document Revised: 11/08/2020 Document Reviewed: 11/08/2020  Elsevier Patient Education  2024 [employer].

## 2023-12-26 NOTE — Progress Notes (Signed)
 Complete physical exam  Patient: Kathy Blankenship   DOB: 1971/05/23   53 y.o. Female  MRN: [id number]  Subjective:    Chief Complaint  Patient presents with   Annual Exam    Kathy Blankenship is a 53 y.o. female who presents today for a complete physical exam. She reports consuming a general diet. The patient does not participate in regular exercise at present. She generally feels well. She reports sleeping well. She does not have additional problems to discuss today.    Most recent fall risk assessment:    11/04/2022    2:49 PM  Fall Risk   Falls in the past year? 0  Number falls in past yr: 0  Injury with Fall? 0  Risk for fall due to : No Fall Risks  Follow up Falls evaluation completed     Most recent depression screenings:    12/26/2023    2:35 PM 11/04/2022    2:49 PM  PHQ 2/9 Scores  PHQ - 2 Score 0 0    Vision:Within last year, Dental: No current dental problems and Receives regular dental care, and STD: The patient reports a past history of: herpes    Patient Care Team: Willo Mini, NP as PCP - General (Nurse Practitioner)   Outpatient Medications Prior to Visit  Medication Sig   [DISCONTINUED] Vitamin D , Ergocalciferol , (DRISDOL ) 1.25 MG (50000 UNIT) CAPS capsule Take 1 capsule (50,000 Units total) by mouth every 7 (seven) days.   No facility-administered medications prior to visit.    Review of Systems  Constitutional:  Negative for chills, fever, malaise/fatigue and weight loss.  HENT:  Negative for congestion, ear pain, hearing loss, sinus pain and sore throat.   Eyes:  Negative for blurred vision, photophobia and pain.  Respiratory:  Negative for cough, shortness of breath and wheezing.   Cardiovascular:  Negative for chest pain, palpitations and leg swelling.  Gastrointestinal:  Positive for abdominal pain. Negative for constipation, diarrhea, heartburn, nausea and vomiting.  Genitourinary:  Negative for dysuria, frequency and urgency.  Musculoskeletal:   Negative for falls and neck pain.  Skin:  Negative for itching and rash.  Neurological:  Negative for dizziness, weakness and headaches.  Endo/Heme/Allergies:  Negative for polydipsia. Does not bruise/bleed easily.  Psychiatric/Behavioral:  Negative for depression, substance abuse and suicidal ideas. The patient is not nervous/anxious.      Objective:    BP 111/73 (BP Location: Right Arm, Cuff Size: Normal)   Pulse 82   Resp 20   Ht 5' 5 (1.651 m)   Wt 161 lb (73 kg)   SpO2 98%   BMI 26.79 kg/m    Physical Exam Constitutional:      General: She is not in acute distress.    Appearance: Normal appearance. She is not ill-appearing.  HENT:     Head: Normocephalic and atraumatic.     Right Ear: Tympanic membrane, ear canal and external ear normal. There is no impacted cerumen.     Left Ear: Tympanic membrane, ear canal and external ear normal. There is no impacted cerumen.     Nose: Nose normal. No congestion or rhinorrhea.     Mouth/Throat:     Mouth: Mucous membranes are moist.     Pharynx: No oropharyngeal exudate or posterior oropharyngeal erythema.  Eyes:     General: No scleral icterus.       Right eye: No discharge.        Left eye: No discharge.  Extraocular Movements: Extraocular movements intact.     Conjunctiva/sclera: Conjunctivae normal.     Pupils: Pupils are equal, round, and reactive to light.  Neck:     Thyroid: No thyromegaly.     Vascular: No carotid bruit or JVD.     Trachea: Trachea normal.  Cardiovascular:     Rate and Rhythm: Normal rate and regular rhythm.     Pulses: Normal pulses.     Heart sounds: Normal heart sounds. No murmur heard.    No friction rub. No gallop.  Pulmonary:     Effort: Pulmonary effort is normal. No respiratory distress.     Breath sounds: Normal breath sounds. No wheezing.  Abdominal:     General: Bowel sounds are normal. There is no distension.     Palpations: Abdomen is soft.     Tenderness: There is no abdominal  tenderness. There is no guarding.  Musculoskeletal:        General: Normal range of motion.     Cervical back: Normal range of motion and neck supple.  Lymphadenopathy:     Cervical: No cervical adenopathy.  Skin:    General: Skin is warm and dry.  Neurological:     Mental Status: She is alert and oriented to person, place, and time.     Cranial Nerves: No cranial nerve deficit.  Psychiatric:        Mood and Affect: Mood normal.        Behavior: Behavior normal.        Thought Content: Thought content normal.        Judgment: Judgment normal.      No results found for any visits on 12/26/23.     Assessment & Plan:    Routine Health Maintenance and Physical Exam  Immunization History  Administered Date(s) Administered   Influenza Whole 03/08/2008   Influenza,inj,Quad PF,6+ Mos 04/23/2016, 03/03/2017, 03/14/2018, 02/06/2019, 02/26/2022   Influenza,inj,quad, With Preservative 02/19/2014   Influenza-Unspecified 02/19/2014, 02/25/2020   Moderna Sars-Covid-2 Vaccination 08/19/2019, 09/16/2019, 03/27/2020   Tdap 12/20/2013   Discussed health benefits of physical activity, and encouraged her to engage in regular exercise appropriate for her age and condition.  1. Annual physical exam (Primary) Checking labs as below. UTD on preventative care. Wellness information provided with AVS. - Lipid panel - CBC with Differential/Platelet - CMP14+EGFR  2. Breast cancer screening by mammogram Mammogram ordered.  - MM DIGITAL SCREENING BILATERAL; Future  3. Hyperlipidemia, unspecified hyperlipidemia type Checking labs today. Recommend a low fat heart healthy diet with regular intentional exercise.  - Lipid panel - CMP14+EGFR  4. Vitamin D  deficiency Checking vitamin D .    Return in about 1 year (around 12/25/2024) for annual physical exam or sooner if needed.   Harlow Basley, NP

## 2023-12-27 LAB — CBC WITH DIFFERENTIAL/PLATELET
Basophils Absolute: 0.1 x10E3/uL (ref 0.0–0.2)
Basos: 1 %
EOS (ABSOLUTE): 0.2 x10E3/uL (ref 0.0–0.4)
Eos: 2 %
Hematocrit: 39.2 % (ref 34.0–46.6)
Hemoglobin: 12.9 g/dL (ref 11.1–15.9)
Immature Grans (Abs): 0 x10E3/uL (ref 0.0–0.1)
Immature Granulocytes: 0 %
Lymphocytes Absolute: 3.2 x10E3/uL — ABNORMAL HIGH (ref 0.7–3.1)
Lymphs: 52 %
MCH: 27.9 pg (ref 26.6–33.0)
MCHC: 32.9 g/dL (ref 31.5–35.7)
MCV: 85 fL (ref 79–97)
Monocytes Absolute: 0.4 x10E3/uL (ref 0.1–0.9)
Monocytes: 7 %
Neutrophils Absolute: 2.4 x10E3/uL (ref 1.4–7.0)
Neutrophils: 38 %
Platelets: 249 x10E3/uL (ref 150–450)
RBC: 4.62 x10E6/uL (ref 3.77–5.28)
RDW: 13.6 % (ref 11.7–15.4)
WBC: 6.2 x10E3/uL (ref 3.4–10.8)

## 2023-12-27 LAB — CMP14+EGFR
ALT: 21 IU/L (ref 0–32)
AST: 18 IU/L (ref 0–40)
Albumin: 4.5 g/dL (ref 3.9–4.9)
Alkaline Phosphatase: 85 IU/L (ref 44–121)
BUN/Creatinine Ratio: 16 (ref 9–23)
BUN: 12 mg/dL (ref 6–24)
Bilirubin Total: 0.5 mg/dL (ref 0.0–1.2)
CO2: 22 mmol/L (ref 20–29)
Calcium: 9.6 mg/dL (ref 8.7–10.2)
Chloride: 105 mmol/L (ref 96–106)
Creatinine, Ser: 0.74 mg/dL (ref 0.57–1.00)
Globulin, Total: 2.7 g/dL (ref 1.5–4.5)
Glucose: 81 mg/dL (ref 70–99)
Potassium: 4.1 mmol/L (ref 3.5–5.2)
Sodium: 142 mmol/L (ref 134–144)
Total Protein: 7.2 g/dL (ref 6.0–8.5)
eGFR: 99 mL/min/1.73 (ref >59)

## 2023-12-27 LAB — LIPID PANEL
Chol/HDL Ratio: 3.4 ratio (ref 0.0–4.4)
Cholesterol, Total: 185 mg/dL (ref 100–199)
HDL: 55 mg/dL (ref >39)
LDL Chol Calc (NIH): 114 mg/dL — ABNORMAL HIGH (ref 0–99)
Triglycerides: 85 mg/dL (ref 0–149)
VLDL Cholesterol Cal: 16 mg/dL (ref 5–40)

## 2023-12-27 LAB — VITAMIN D 25 HYDROXY (VIT D DEFICIENCY, FRACTURES): Vit D, 25-Hydroxy: 19 ng/mL — ABNORMAL LOW (ref 30.0–100.0)

## 2023-12-29 ENCOUNTER — Ambulatory Visit: Payer: Self-pay | Admitting: Medical-Surgical

## 2024-01-05 ENCOUNTER — Other Ambulatory Visit: Payer: Self-pay | Admitting: Medical-Surgical

## 2024-01-05 DIAGNOSIS — Z1231 Encounter for screening mammogram for malignant neoplasm of breast: Secondary | ICD-10-CM

## 2024-01-21 ENCOUNTER — Ambulatory Visit

## 2024-01-21 DIAGNOSIS — Z1231 Encounter for screening mammogram for malignant neoplasm of breast: Secondary | ICD-10-CM | POA: Diagnosis not present

## 2024-01-22 ENCOUNTER — Ambulatory Visit: Payer: Self-pay

## 2024-01-22 NOTE — Telephone Encounter (Signed)
  FYI Only or Action Required?: FYI only for provider.  Patient was last seen in primary care on 12/26/2023 by Willo Mini, NP.  Called Nurse Triage reporting Covid Positive.  Symptoms began several days ago.  Interventions attempted: Nothing.  Symptoms are: gradually worsening.  Triage Disposition: See HCP Within 4 Hours (Or PCP Triage)  Patient/caregiver understands and will follow disposition?: Yes  Patient calling in because she took a COVID TEST(binax now) and it was positive and wanted to see if they were accurate and if she needs to come in office to take one.  Call back [phone]    Reason for Disposition  MILD difficulty breathing (e.g., minimal/no SOB at rest, SOB with walking, pulse <100)  Answer Assessment - Initial Assessment Questions 1. COVID-19 DIAGNOSIS: How do you know that you have COVID? (e.g., positive lab test or self-test, diagnosed by doctor or NP/PA, symptoms after exposure).     Home test today, test positive 2. COVID-19 EXPOSURE: Was there any known exposure to COVID before the symptoms began? CDC Definition of close contact: within 6 feet (2 meters) for a total of 15 minutes or more over a 24-hour period.      unknown 3. ONSET: When did the COVID-19 symptoms start?      Three days ago 4. WORST SYMPTOM: What is your worst symptom? (e.g., cough, fever, shortness of breath, muscle aches)     States that she feels a little winded when up and around the house and has nasal congestion symptoms 5. COUGH: Do you have a cough? If Yes, ask: How bad is the cough?       Cough which is resolving. Non productive cough 6. FEVER: Do you have a fever? If Yes, ask: What is your temperature, how was it measured, and when did it start?     Denies, but has had chills 7. RESPIRATORY STATUS: Describe your breathing? (e.g., normal; shortness of breath, wheezing, unable to speak)      Mild sob with activity 8. BETTER-SAME-WORSE: Are you getting better,  staying the same or getting worse compared to yesterday?  If getting worse, ask, In what way?     Mildly worse, more nasal congestion and mild SOB is new symptom 9. OTHER SYMPTOMS: Do you have any other symptoms?  (e.g., chills, fatigue, headache, loss of smell or taste, muscle pain, sore throat)     Achy, but resolved, fatigue, decreased appetite 10. HIGH RISK DISEASE: Do you have any chronic medical problems? (e.g., asthma, heart or lung disease, weak immune system, obesity, etc.)       denies 11. VACCINE: Have you had the COVID-19 vaccine? If Yes, ask: Which one, how many shots, when did you get it?       No recent vaccination 12. PREGNANCY: Is there any chance you are pregnant? When was your last menstrual period?       N/A 13. O2 SATURATION MONITOR:  Do you use an oxygen saturation monitor (pulse oximeter) at home? If Yes, ask What is your reading (oxygen level) today? What is your usual oxygen saturation reading? (e.g., 95%)       99% HR 115  Protocols used: Coronavirus (COVID-19) Diagnosed or Suspected-A-AH

## 2024-01-23 ENCOUNTER — Ambulatory Visit

## 2024-01-23 ENCOUNTER — Encounter: Payer: Self-pay | Admitting: Medical-Surgical

## 2024-01-23 ENCOUNTER — Telehealth (INDEPENDENT_AMBULATORY_CARE_PROVIDER_SITE_OTHER): Admitting: Medical-Surgical

## 2024-01-23 DIAGNOSIS — U071 COVID-19: Secondary | ICD-10-CM

## 2024-01-23 LAB — POC COVID19/FLU A&B COMBO
Covid Antigen, POC: POSITIVE — AB
Influenza A Antigen, POC: NEGATIVE
Influenza B Antigen, POC: NEGATIVE

## 2024-01-23 MED ORDER — ALBUTEROL SULFATE HFA 108 (90 BASE) MCG/ACT IN AERS: 2.0000 | INHALATION_SPRAY | Freq: Four times a day (QID) | RESPIRATORY_TRACT | 0 refills | Status: DC | PRN

## 2024-01-23 NOTE — Progress Notes (Signed)
 Virtual Visit via Video Note  I connected with Kathy Blankenship on 01/23/24 at 10:30 AM EDT by a video enabled telemedicine application and verified that I am speaking with the correct person using two identifiers.   I discussed the limitations of evaluation and management by telemedicine and the availability of in person appointments. The patient expressed understanding and agreed to proceed.  Patient location: home Provider locations: office  Subjective:    CC: COVID-19 positive  HPI: Very pleasant 53 year old female presenting via MyChart video visit with reports of testing positive for COVID yesterday using home test.  Reports 5 days of upper respiratory symptoms including nasal congestion, nonproductive cough, body aches, intermittent mild shortness of breath, and generalized fatigue.  Denies fever.  Initially used Sudafed with some relief of symptoms but when she saw the positive COVID test yesterday, she stopped taking this as she was not sure if this was recommended.  Requesting to have a COVID test run in our clinic for verification of her diagnosis.  Past medical history, Surgical history, Family history not pertinant except as noted below, Social history, Allergies, and medications have been entered into the medical record, reviewed, and corrections made.   Review of Systems: See HPI for pertinent positives and negatives.   Objective:    General: Speaking clearly in complete sentences without any shortness of breath.  Alert and oriented x3.  Normal judgment. No apparent acute distress.  Impression and Recommendations:    COVID-19 infection COVID-19 infection with nasal congestion, non-productive cough, and chest pressure. No fever. Symptoms beyond antiviral treatment stage. Focus on symptomatic relief. Discussed natural course and potential lingering symptoms. Advised further treatment if symptoms worsen or persist beyond 7-10 days. Reviewed return to work guidelines, no special  testing required. - Prescribed albuterol  inhaler for chest congestion and shortness of breath. - Recommended acetaminophen, cold and flu preparations, and guaifenesin for symptomatic relief. - Ordered chest x-ray to evaluate shortness of breath. - Advised symptom monitoring and return to work when [employer] for 24 hours w/o antipyretic use  I discussed the assessment and treatment plan with the patient. The patient was provided an opportunity to ask questions and all were answered. The patient agreed with the plan and demonstrated an understanding of the instructions.   The patient was advised to call back or seek an in-person evaluation if the symptoms worsen or if the condition fails to improve as anticipated.  Return if symptoms worsen or fail to improve.  Zada FREDRIK Palin, DNP, APRN, FNP-BC [facility]

## 2024-01-23 NOTE — Telephone Encounter (Signed)
 Patient seen in office today with Cherre Cornish, NP

## 2024-01-25 ENCOUNTER — Ambulatory Visit: Payer: Self-pay | Admitting: Medical-Surgical

## 2024-01-27 ENCOUNTER — Encounter: Payer: Self-pay | Admitting: Sports Medicine

## 2024-01-27 ENCOUNTER — Ambulatory Visit: Payer: Self-pay | Admitting: Medical-Surgical

## 2024-02-05 ENCOUNTER — Encounter: Payer: Self-pay | Admitting: Medical-Surgical

## 2024-02-05 ENCOUNTER — Ambulatory Visit (INDEPENDENT_AMBULATORY_CARE_PROVIDER_SITE_OTHER): Admitting: Medical-Surgical

## 2024-02-05 VITALS — BP 114/76 | HR 77 | Temp 98.6°F | Resp 20 | Ht 65.0 in | Wt 151.0 lb

## 2024-02-05 DIAGNOSIS — J019 Acute sinusitis, unspecified: Secondary | ICD-10-CM | POA: Diagnosis not present

## 2024-02-05 DIAGNOSIS — B9689 Other specified bacterial agents as the cause of diseases classified elsewhere: Secondary | ICD-10-CM | POA: Diagnosis not present

## 2024-02-05 MED ORDER — AMOXICILLIN-POT CLAVULANATE 875-125 MG PO TABS: 1.0000 | ORAL_TABLET | Freq: Two times a day (BID) | ORAL | 0 refills | Status: DC

## 2024-02-05 MED ORDER — METHYLPREDNISOLONE ACETATE 80 MG/ML IJ SUSP
80.0000 mg | Freq: Once | INTRAMUSCULAR | Status: AC
  Administered 2024-02-05: 80 mg via INTRAMUSCULAR

## 2024-02-05 MED ORDER — COVID-19 MRNA VACC (MODERNA) 50 MCG/0.5ML IM SUSY: 0.5000 mL | PREFILLED_SYRINGE | Freq: Once | INTRAMUSCULAR | 0 refills | Status: AC

## 2024-02-05 NOTE — Progress Notes (Signed)
        Established patient visit   History of Present Illness   Discussed the use of AI scribe software for clinical note transcription with the patient, who gave verbal consent to proceed.  History of Present Illness   Kathy Blankenship is a 53 year old female who presents with persistent sinus congestion following a COVID-19 infection.  Upper respiratory symptoms - Persistent sinus congestion and sinus pressure, occasionally painful and worse in the evening - Persistent symptoms despite use of Sudafed (12-hour extended-release) and Xyzal  Dyspnea and chest tightness - Chest tightness and exertional dyspnea occur if a dose of Sudafed is missed - Symptoms relieved by use of an albuterol  inhaler  Sleep disturbance - Significant sleep disruption, with only three nights of uninterrupted sleep in the past two and a half weeks - Sleep disturbance affecting overall well-being   Functional impairment - Concern about ability to return to work next week due to ongoing symptoms      Physical Exam   Physical Exam Vitals reviewed.  Constitutional:      General: She is not in acute distress.    Appearance: Normal appearance. She is ill-appearing.  HENT:     Head: Normocephalic and atraumatic.     Right Ear: Tympanic membrane, ear canal and external ear normal. There is no impacted cerumen.     Left Ear: Tympanic membrane, ear canal and external ear normal. There is no impacted cerumen.     Nose: Congestion present.     Mouth/Throat:     Mouth: Mucous membranes are moist.     Pharynx: Posterior oropharyngeal erythema present.  Eyes:     General:        Right eye: Discharge present.        Left eye: Discharge present.    Extraocular Movements: Extraocular movements intact.     Pupils: Pupils are equal, round, and reactive to light.  Cardiovascular:     Rate and Rhythm: Normal rate and regular rhythm.     Pulses: Normal pulses.     Heart sounds: Normal heart sounds. No murmur heard.     No friction rub. No gallop.  Pulmonary:     Effort: Pulmonary effort is normal. No respiratory distress.     Breath sounds: Normal breath sounds. No wheezing.  Lymphadenopathy:     Cervical: Cervical adenopathy present.  Skin:    General: Skin is warm and dry.  Neurological:     Mental Status: She is alert and oriented to person, place, and time.  Psychiatric:        Mood and Affect: Mood normal.        Behavior: Behavior normal.        Thought Content: Thought content normal.        Judgment: Judgment normal.    Assessment & Plan   Assessment and Plan    Acute bacterial sinusitis Persistent sinus congestion and pressure with pain suggest secondary bacterial infection post-viral course. Previous symptomatic treatments provided partial relief. Normal chest x-ray ruled out significant lower respiratory involvement. - Prescribed Augmentin  875 mg twice daily for 7 days. Extend to 10 days if symptoms persist by day 6 or 7. - Administered Depo Medrol  80mg  injection for immediate relief and to support antibiotic efficacy.        Follow up   Return if symptoms worsen or fail to improve. __________________________________ Zada FREDRIK Palin, DNP, APRN, FNP-BC Primary Care and Sports Medicine [facility] [place]

## 2024-02-11 ENCOUNTER — Encounter: Payer: Self-pay | Admitting: Medical-Surgical

## 2024-02-11 MED ORDER — ALBUTEROL SULFATE HFA 108 (90 BASE) MCG/ACT IN AERS: 2.0000 | INHALATION_SPRAY | Freq: Four times a day (QID) | RESPIRATORY_TRACT | 0 refills | Status: AC | PRN

## 2024-02-11 MED ORDER — AMOXICILLIN-POT CLAVULANATE 875-125 MG PO TABS: 1.0000 | ORAL_TABLET | Freq: Two times a day (BID) | ORAL | 0 refills | Status: DC

## 2024-02-17 ENCOUNTER — Encounter: Payer: Self-pay | Admitting: Medical-Surgical

## 2024-02-17 ENCOUNTER — Ambulatory Visit (INDEPENDENT_AMBULATORY_CARE_PROVIDER_SITE_OTHER): Admitting: Medical-Surgical

## 2024-02-17 VITALS — BP 111/73 | HR 104 | Temp 97.9°F | Resp 18 | Ht 65.0 in | Wt 144.0 lb

## 2024-02-17 DIAGNOSIS — R0981 Nasal congestion: Secondary | ICD-10-CM | POA: Diagnosis not present

## 2024-02-17 DIAGNOSIS — H6123 Impacted cerumen, bilateral: Secondary | ICD-10-CM

## 2024-02-17 NOTE — Progress Notes (Signed)
        Established patient visit   History of Present Illness   Discussed the use of AI scribe software for clinical note transcription with the patient, who gave verbal consent to proceed.  History of Present Illness   Kathy Blankenship is a 53 year old female who presents with left ear blockage and congestion.  Left ear congestion and hearing changes - Clogged and blocked sensation in the left ear for approximately one week - Associated muffled hearing in the left ear - No use of Q-tips; cleans ears with a washcloth  Nasal congestion and rhinorrhea - Sensation of nasal blockage but able to breathe through nostrils - Occasional clear nasal discharge - Nostrils remain moist, attributed to increased water intake - Uses saline nasal spray and a humidifier at night - Has tried Nasacort ; does not use Flonase - Avoids foods that may irritate congestion  Respiratory symptoms - Difficulty breathing on Friday night, improved over the weekend - Uses Xyzal 5 mg and an inhaler as needed; used inhaler three times the previous day - Completed a course of amoxicillin , last dose on Sunday   Physical Exam   Physical Exam Vitals reviewed.  Constitutional:      General: She is not in acute distress.    Appearance: Normal appearance.  HENT:     Head: Normocephalic and atraumatic.     Right Ear: There is impacted cerumen.     Left Ear: There is impacted cerumen.  Cardiovascular:     Rate and Rhythm: Normal rate and regular rhythm.     Pulses: Normal pulses.     Heart sounds: Normal heart sounds. No murmur heard.    No friction rub. No gallop.  Pulmonary:     Effort: Pulmonary effort is normal. No respiratory distress.     Breath sounds: Normal breath sounds. No wheezing.  Skin:    General: Skin is warm and dry.  Neurological:     Mental Status: She is alert and oriented to person, place, and time.  Psychiatric:        Mood and Affect: Mood normal.        Behavior: Behavior normal.         Thought Content: Thought content normal.        Judgment: Judgment normal.    Assessment & Plan     Impacted cerumen, bilateral ears Bilateral earwax impaction causing blockage and muffled hearing confirmed by otoscopic examination. - Perform ear irrigation to remove impacted cerumen from both ears.  Nasal congestion and suspected allergic rhinitis Chronic nasal congestion with symptoms suggestive of allergic rhinitis. Xyzal provides some relief. Flonase ineffective, consider Nasacort  or other nasal sprays. - Continue Xyzal 5 mg as needed. - Consider using Nasacort  or other nasal sprays to reduce nasal inflammation. - Encourage use of saline nasal spray and humidifier to maintain nasal moisture.    Follow up   Return if symptoms worsen or fail to improve. __________________________________ Zada FREDRIK Palin, DNP, APRN, FNP-BC Primary Care and Sports Medicine [facility] [place]

## 2024-03-02 ENCOUNTER — Encounter: Payer: Self-pay | Admitting: Medical-Surgical

## 2024-03-02 DIAGNOSIS — J329 Chronic sinusitis, unspecified: Secondary | ICD-10-CM

## 2024-03-02 NOTE — Telephone Encounter (Signed)
 Route to PCP

## 2024-03-05 MED ORDER — PREDNISONE 50 MG PO TABS: 50.0000 mg | ORAL_TABLET | Freq: Every day | ORAL | 0 refills | Status: DC

## 2024-03-10 ENCOUNTER — Other Ambulatory Visit

## 2024-03-10 DIAGNOSIS — J329 Chronic sinusitis, unspecified: Secondary | ICD-10-CM

## 2024-03-15 ENCOUNTER — Ambulatory Visit: Payer: Self-pay | Admitting: Medical-Surgical

## 2024-03-23 ENCOUNTER — Encounter: Payer: Self-pay | Admitting: Medical-Surgical

## 2024-03-23 ENCOUNTER — Ambulatory Visit (INDEPENDENT_AMBULATORY_CARE_PROVIDER_SITE_OTHER): Admitting: Medical-Surgical

## 2024-03-23 VITALS — BP 123/82 | HR 73 | Resp 20 | Ht 65.0 in | Wt 138.3 lb

## 2024-03-23 DIAGNOSIS — L309 Dermatitis, unspecified: Secondary | ICD-10-CM

## 2024-03-23 DIAGNOSIS — K5900 Constipation, unspecified: Secondary | ICD-10-CM

## 2024-03-23 DIAGNOSIS — J329 Chronic sinusitis, unspecified: Secondary | ICD-10-CM

## 2024-03-23 MED ORDER — CLOBETASOL PROPIONATE 0.05 % EX OINT: 1.0000 | TOPICAL_OINTMENT | Freq: Two times a day (BID) | CUTANEOUS | 0 refills | Status: AC

## 2024-03-23 MED ORDER — POLYETHYLENE GLYCOL 3350 17 G PO PACK: 17.0000 g | PACK | Freq: Every day | ORAL | 0 refills | Status: AC

## 2024-03-23 NOTE — Progress Notes (Signed)
 Established patient visit   History of Present Illness   Discussed the use of AI scribe software for clinical note transcription with the patient, who gave verbal consent to proceed.  History of Present Illness   Kathy Blankenship is a 53 year old female who presents with dry, cracked, and painful hands, appetite loss, and constipation.  Xerosis and fissuring of the hands - Extremely dry, cracked, and painful skin on the hands, especially at the fingertips - Symptoms have progressively worsened over time - Skin cracks and bleeds, causing significant discomfort - Vaseline and Eucerin provide only temporary relief  Anorexia and unintentional weight loss - Significant loss of appetite over the past few months - Weight decreased from 160 pounds to approximately 132-134 pounds - Difficulty eating due to lack of taste for food, despite intact sense of taste and smell - Minimal dietary intake - Fatigue associated with meals - Drinking Glucerna shakes  Chronic constipation - Bowel movements occur once every two to three weeks - Stools are hard and difficult to pass - Likely related to reduced food intake - No use of over-the-counter remedies for constipation  Nasal and oral mucosal dryness with congestion - Persistent nasal congestion and difficulty breathing through the nose - Continued nasal and oral dryness, particularly at night - Uses Xyzal, Simply Saline, and a humidifier for symptom management with limited relief     Physical Exam   Physical Exam Vitals reviewed.  Constitutional:      General: She is not in acute distress.    Appearance: Normal appearance.  HENT:     Head: Normocephalic and atraumatic.  Cardiovascular:     Rate and Rhythm: Normal rate and regular rhythm.     Pulses: Normal pulses.     Heart sounds: Normal heart sounds. No murmur heard.    No friction rub. No gallop.  Pulmonary:     Effort: Pulmonary effort is normal. No respiratory distress.      Breath sounds: Normal breath sounds. No wheezing.  Skin:    General: Skin is warm and dry.     Comments: Dry, cracked skin to the several fingertips on both hands. Some small scabs present, no active bleeding or infection.  Neurological:     Mental Status: She is alert and oriented to person, place, and time.  Psychiatric:        Mood and Affect: Mood normal.        Behavior: Behavior normal.        Thought Content: Thought content normal.        Judgment: Judgment normal.    Assessment & Plan     Chronic hand dermatitis with fissuring and pain Chronic hand dermatitis with dry, cracked, and bleeding skin. Symptoms persist despite emollients. Presentation consistent with dermatitis, fungal infection less likely. - Prescribed clobetasol ointment twice daily for 14 days. - Advised use of moisture gloves at night after ointment application.  Constipation Chronic constipation with infrequent bowel movements and hard stools, likely due to poor diet and dehydration. No prior OTC treatment use. - Recommended Miralax 17g daily for 3-4 days, then as needed, in 4-8 oz non-carbonated fluid. - Advised increasing fluid intake.  Unintentional weight loss and poor appetite Weight loss from 160 lbs to 132-134 lbs with poor appetite, likely secondary to constipation and recent illness. - Encouraged protein shakes like Premier Protein and calorie-dense foods. - Suggested Power Crunch protein bars for additional protein and calories. - Advised increasing  fluid intake.  Chronic nasal congestion and possible allergic rhinitis Chronic nasal congestion possibly related to allergic rhinitis, unresponsive to OTC medications. - Referred to ENT for evaluation. - Discussed potential use of Nasacort  if recommended by ENT. - Referred to Allergy for testing.      Follow up   Return if symptoms worsen or fail to improve. __________________________________ Zada FREDRIK Palin, DNP, APRN, FNP-BC Primary Care and  Sports Medicine [facility] [place]

## 2024-04-23 ENCOUNTER — Encounter (INDEPENDENT_AMBULATORY_CARE_PROVIDER_SITE_OTHER): Payer: Self-pay | Admitting: Otolaryngology

## 2024-04-23 ENCOUNTER — Ambulatory Visit (INDEPENDENT_AMBULATORY_CARE_PROVIDER_SITE_OTHER): Admitting: Otolaryngology

## 2024-04-23 VITALS — BP 113/78 | HR 79 | Resp 99 | Wt 128.0 lb

## 2024-04-23 DIAGNOSIS — R0981 Nasal congestion: Secondary | ICD-10-CM

## 2024-04-23 DIAGNOSIS — J3089 Other allergic rhinitis: Secondary | ICD-10-CM | POA: Diagnosis not present

## 2024-04-23 MED ORDER — FLUTICASONE PROPIONATE 50 MCG/ACT NA SUSP
2.0000 | Freq: Every day | NASAL | 6 refills | Status: AC
Start: 2024-04-23 — End: ?

## 2024-04-23 NOTE — Progress Notes (Signed)
 Reason for Consult: Chronic rhinitis Referring Physician: Dr. Willo Chime Notaro is an 53 y.o. female.  HPI: History of congestion of her nose.  It fluctuates during the day.  She did notice that spicy foods make it worse so she stopped those.  She has developed this after COVID in August.  She has not been on a steroid spray.  She tried amoxicillin .  She tries saline occasionally.  She has been on Xyzal and Sudafed.  Neither 1 of those seem to help.  They in fact seem to give her some dryness.  She had a CT scan which showed Blankenship evidence of sinusitis.  She has not had a significant issue with this previously.  She does have an appointment next week with an allergist.  Past Medical History:  Diagnosis Date   Fibroids    HSV-2 (herpes simplex virus 2) infection    Irregular menstrual cycle 03/26/2016   Left ovarian cyst     Past Surgical History:  Procedure Laterality Date   COLONOSCOPY  2006   MOLE REMOVAL  1995    Family History  Problem Relation Age of Onset   Rheum arthritis Father    Heart attack Father    Hypertension Mother     Social History:  reports that she has never smoked. She has never used smokeless tobacco. She reports that she does not drink alcohol and does not use drugs.  Allergies:  Allergies  Allergen Reactions   Lamisil  [Terbinafine ] Rash   Terbinafine  Rash    Medications: I have reviewed the patient's current medications.  Blankenship results found for this or any previous visit (from the past 48 hours).  Blankenship results found.  ROS Blood pressure 113/78, pulse 79, resp. rate (!) 99, weight 128 lb (58.1 kg). Physical Exam Constitutional:      Appearance: Normal appearance.  HENT:     Head: Normocephalic and atraumatic.     Right Ear: Tympanic membrane is without lesions and middle ear aerated, ear canal and external ear normal.     Left Ear: Tympanic membrane is without lesions and middle ear aerated, ear canal and external ear normal.     Nose: Nose  normal. Turbinates with mild hypertrophy, Blankenship significant swelling or masses.     Oral cavity/oropharynx: Mucous membranes are moist. Blankenship lesions or masses    Larynx: normal voice. Mirror attempted without success    Eyes:     Extraocular Movements: Extraocular movements intact.     Conjunctiva/sclera: Conjunctivae normal.     Pupils: Pupils are equal, round, and reactive to light.  Cardiovascular:     Rate and Rhythm: Normal rate.  Pulmonary:     Effort: Pulmonary effort is normal.  Musculoskeletal:     Cervical back: Normal range of motion and neck supple. Blankenship rigidity.  Lymphadenopathy:     Cervical: Blankenship cervical adenopathy or masses.salivary glands without lesions. .  Neurological:     Mental Status: He is alert. CN 2-12 intact. Blankenship nystagmus      Assessment/Plan:  Rhinitis-she has nasal congestion and drainage.  There is Blankenship sinusitis on CT scan.  She needs to be on a nasal steroid spray.  There is a possibility that this is a vasomotor rhinitis problem that resulted from her viral infection.  First we will try the nasal steroid spray for 3 weeks and if she is not resolved we will switch to Atrovent.  She is due to see an allergist.  Norleen Notice 04/23/2024, 9:24 AM

## 2024-04-26 ENCOUNTER — Other Ambulatory Visit: Payer: Self-pay

## 2024-04-26 ENCOUNTER — Ambulatory Visit: Admitting: Allergy

## 2024-04-26 ENCOUNTER — Encounter: Payer: Self-pay | Admitting: Allergy

## 2024-04-26 VITALS — BP 116/76 | HR 84 | Temp 98.3°F | Resp 16 | Ht 64.5 in | Wt 132.8 lb

## 2024-04-26 DIAGNOSIS — R0602 Shortness of breath: Secondary | ICD-10-CM | POA: Diagnosis not present

## 2024-04-26 DIAGNOSIS — J310 Chronic rhinitis: Secondary | ICD-10-CM | POA: Diagnosis not present

## 2024-04-26 DIAGNOSIS — T7819XD Other adverse food reactions, not elsewhere classified, subsequent encounter: Secondary | ICD-10-CM

## 2024-04-26 DIAGNOSIS — Z8616 Personal history of COVID-19: Secondary | ICD-10-CM

## 2024-04-26 DIAGNOSIS — T7819XA Other adverse food reactions, not elsewhere classified, initial encounter: Secondary | ICD-10-CM

## 2024-04-26 NOTE — Patient Instructions (Addendum)
 Rhinitis  Stop taking Xyzal.  Use Flonase (fluticasone) nasal spray 1-2 sprays per nostril once a day as needed for nasal congestion.  If no improvement, then consider allergy testing next but I think it just took you awhile to recover from the covid-19 infection.   Breathing Normal breathing test today. May use albuterol  rescue inhaler 2 puffs every 4 to 6 hours as needed for shortness of breath, chest tightness, coughing, and wheezing.  Monitor frequency of use - if you need to use it more than twice per week on a consistent basis let us  know.   Chocolate Limit chocolate intake due to past history of itching. For mild symptoms you can take over the counter antihistamines (zyrtec  10mg  to 20mg ) and monitor symptoms closely.  If symptoms worsen or if you have severe symptoms including breathing issues, throat closure, significant swelling, whole body hives, severe diarrhea and vomiting, lightheadedness then seek immediate medical care.   Follow up as needed.   Skin testing instructions:  Will make additional recommendations based on results. Make sure you don't take any antihistamines for 3 days before the skin testing appointment. Don't put any lotion on the back and arms on the day of testing.  Must be in good health and not ill. No vaccines/injections/antibiotics within the past 7 days.  Plan on being here for 30-60 minutes.

## 2024-04-26 NOTE — Progress Notes (Signed)
 New Patient Note  RE: Kathy Blankenship MRN: [id number] DOB: 04-24-1971 Date of Office Visit: 04/26/2024  Consult requested by: Willo Mini, NP Primary care provider: Willo Mini, NP  Chief Complaint: Establish Care (She states she had Covid back in October, she has some coughing and SOB and she is using inhaler. She has some drainage and she takes Xyzal that it helps but she would like to have allergy test. )  History of Present Illness: I had the pleasure of seeing Kathy Blankenship for initial evaluation at the [facility] on 04/26/2024. She is a 53 y.o. female, who is referred here by Willo Mini, NP for the evaluation of chronic nasal congestion.  Discussed the use of AI scribe software for clinical note transcription with the patient, who gave verbal consent to proceed.    Her symptoms began in late August following a COVID-19 infection, characterized by sinus congestion and nasal drainage. Despite some improvement in her overall condition, these symptoms have persisted. Over the past three weeks, she has experienced better sleep and appetite, but the sinus issues remain.  The congestion is primarily nasal, with occasional chest involvement. She was prescribed albuterol  but has not needed to use it in over a month. Nasal drainage sometimes requires immediate use of tissue, and certain foods, particularly spicy ones, exacerbate her symptoms.  She reports that the ENT performed imaging and a nasal examination and told her that there was no sinusitis or polyps. She has been using Xyzal, which seems to help but may cause dryness.  No recent sinus infections and no allergy testing in over ten years. She reports sensitivity to certain flower arrangements and spices, and a possible mild allergy to chocolate, which causes itching. No headaches, heartburn, reflux, or frequent infections. She has received flu, COVID, and Tdap vaccines this year.  She mentions a significant weight loss  of 30 pounds? over two months due to lack of appetite and difficulty breathing while eating, but she is slowly recovering. She maintains a humidifier in her room, has no pets, and vacuums frequently.   She reports symptoms of nasal congestion, rhinorrhea, coughing. Symptoms have been going on for 2-3 months. Anosmia: no. Headache: no. She has used Xyzal with some improvement in symptoms.  Nasacort  didn't work in the past.  Sinus infections: no. Previous work up includes: skin testing in the past was negative over 10+ years ago. Previous ENT evaluation: yes. Previous sinus imaging: yes. 03/10/2024 CT sinus: IMPRESSION: 1. No acute findings.  History of nasal polyps: no.  History of reflux: denies.   04/23/2024 ENT visit: Rhinitis-she has nasal congestion and drainage. There is no sinusitis on CT scan. She needs to be on a nasal steroid spray. There is a possibility that this is a vasomotor rhinitis problem that resulted from her viral infection. First we will try the nasal steroid spray for 3 weeks and if she is not resolved we will switch to Atrovent. She is due to see an allergist.   03/23/2024 PCP visit: Chronic nasal congestion and possible allergic rhinitis Chronic nasal congestion possibly related to allergic rhinitis, unresponsive to OTC medications. - Referred to ENT for evaluation. - Discussed potential use of Nasacort  if recommended by ENT. - Referred to Allergy for testing.    Assessment and Plan: Kathy Blankenship is a 53 y.o. 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 ENT work up unremarkable. Symptoms suggest vasomotor rhinitis. Unlikely  to have allergies as previous years patient denies having any rhinitis symptoms. Stop Xyzal as ineffective. Use Flonase (fluticasone) nasal spray 1-2 sprays per nostril once a day as needed for nasal congestion.  If no improvement with nasal spray then consider  allergy testing next.   History of covid-19 infection Required albuterol  to help with her breathing. Last use was over 1 month ago. Normal spirometry today. May use albuterol  rescue inhaler 2 puffs every 4 to 6 hours as needed for shortness of breath, chest tightness, coughing, and wheezing.  Monitor frequency of use - if you need to use it more than twice per week on a consistent basis let us  know.   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 No formal allergy testing for chocolate conducted. Limit chocolate intake due to past history of itching. For mild symptoms you can take over the counter antihistamines (zyrtec  10mg  to 20mg ) and monitor symptoms closely.  If symptoms worsen or if you have severe symptoms including breathing issues, throat closure, significant swelling, whole body hives, severe diarrhea and vomiting, lightheadedness then seek immediate medical care.     Return if symptoms worsen or fail to improve.  No orders of the defined types were placed in this encounter.  Lab Orders  No laboratory test(s) ordered today    Other allergy screening: Food allergy: no Medication allergy: yes Hymenoptera allergy: no Urticaria: no Eczema:no History of recurrent infections suggestive of immunodeficency: no  Diagnostics: Spirometry:  Tracings reviewed. Her effort: Good reproducible efforts. FVC: 2.80L FEV1: 2.20L, 91% predicted FEV1/FVC ratio: 79% Interpretation: Spirometry consistent with normal pattern.  Please see scanned spirometry results for details.  Results discussed with patient/family.   Past Medical History: Patient Active Problem List   Diagnosis Date Noted   Polyarthralgia 06/16/2019   Spondylolisthesis at L5-S1 level 06/16/2019   Other fatigue 08/26/2016   Vitamin D  deficiency 01/26/2014   Hyperlipidemia 12/20/2013   TIA (transient ischemic attack) 12/20/2013   Seasonal allergies 12/20/2013    Past Medical History:  Diagnosis Date   Fibroids    HSV-2 (herpes simplex virus 2) infection    Irregular menstrual cycle 03/26/2016   Left ovarian cyst    Past Surgical History: Past Surgical History:  Procedure Laterality Date   COLONOSCOPY  2006   MOLE REMOVAL  1995   Medication List:  Current Outpatient Medications  Medication Sig Dispense Refill   albuterol  (VENTOLIN  HFA) 108 (90 Base) MCG/ACT inhaler Inhale 2 puffs into the lungs every 6 (six) hours as needed for wheezing or shortness of breath. 8 g 0   Multiple Vitamins-Minerals (CENTRUM ADULT 50+ MULTIGUMMIES PO) Take 1 tablet by mouth daily.     polyethylene glycol (MIRALAX ) 17 g packet Take 17 g by mouth daily. 14 each 0   QUERCETIN-ZINC PO Take 800 mg by mouth daily.     Vitamin D , Ergocalciferol , 50 MCG (2000 UT) CAPS Take 1 tablet by mouth daily.     Ascorbic Acid (VITAMIN C PO) Take 1,700 mg by mouth daily. (Patient not taking: Reported on 04/26/2024)     clobetasol  ointment (TEMOVATE ) 0.05 % Apply 1 Application topically 2 (two) times daily. (Patient not taking: Reported on 04/26/2024) 60 g 0   fluticasone (FLONASE) 50 MCG/ACT nasal spray Place 2 sprays into both nostrils daily. (Patient not taking: Reported on 04/26/2024) 16 g 6   No current facility-administered medications for this visit.   Allergies: Allergies  Allergen Reactions   Lamisil  [Terbinafine ] Rash  Terbinafine  Rash   Social History: Social History   Socioeconomic History   Marital status: Married    Spouse name: Not on file   Number of children: Not on file   Years of education: Not on file   Highest education level: Not on file  Occupational History   Not on file  Tobacco Use   Smoking status: Never   Smokeless tobacco: Never  Vaping Use   Vaping status: Never Used  Substance and Sexual Activity   Alcohol use: No   Drug use: No   Sexual activity: Yes    Partners: Male    Birth control/protection: Condom, Pill    Comment: zenchent  fe  Other Topics Concern   Not on file  Social History Narrative   Not on file   Social Drivers of Health   Financial Resource Strain: Not on file  Food Insecurity: Not on file  Transportation Needs: Not on file  Physical Activity: Not on file  Stress: Not on file  Social Connections: Not on file   Lives in a house. Smoking: denies Occupation: Regulatory Affairs Officer HistorySurveyor, Minerals in the house: no Engineer, Civil (consulting) in the family room: no Carpet in the bedroom: yes Heating: gas Cooling: central Pet: no  Family History: Family History  Problem Relation Age of Onset   Asthma Mother    Hypertension Mother    Rheum arthritis Father    Heart attack Father    Allergic rhinitis Sister    Asthma Sister    Review of Systems  Constitutional:  Negative for appetite change, chills, fever and unexpected weight change.  HENT:  Positive for congestion. Negative for rhinorrhea.   Eyes:  Negative for itching.  Respiratory:  Positive for shortness of breath. Negative for cough, chest tightness and wheezing.        Improving  Cardiovascular:  Negative for chest pain.  Gastrointestinal:  Negative for abdominal pain.  Genitourinary:  Negative for difficulty urinating.  Skin:  Negative for rash.  Neurological:  Negative for headaches.    Objective: BP 116/76 (BP Location: Left Arm, Patient Position: Sitting, Cuff Size: Normal)   Pulse 84   Temp 98.3 F (36.8 C) (Temporal)   Resp 16   Ht 5' 4.5 (1.638 m)   Wt 132 lb 12.8 oz (60.2 kg)   SpO2 99%   BMI 22.44 kg/m  Body mass index is 22.44 kg/m. Physical Exam Vitals and nursing note reviewed.  Constitutional:      Appearance: Normal appearance. She is well-developed.  HENT:     Head: Normocephalic and atraumatic.     Right Ear: Tympanic membrane and external ear normal.     Left Ear: Tympanic membrane and external ear normal.     Nose: Nose normal.     Mouth/Throat:     Mouth: Mucous membranes are  moist.     Pharynx: Oropharynx is clear.  Eyes:     Conjunctiva/sclera: Conjunctivae normal.  Cardiovascular:     Rate and Rhythm: Normal rate and regular rhythm.     Heart sounds: Normal heart sounds. No murmur heard.    No friction rub. No gallop.  Pulmonary:     Effort: Pulmonary effort is normal.     Breath sounds: Normal breath sounds. No wheezing, rhonchi or rales.  Musculoskeletal:     Cervical back: Neck supple.  Skin:    General: Skin is warm.     Findings: No rash.  Neurological:     Mental  Status: She is alert and oriented to person, place, and time.  Psychiatric:        Behavior: Behavior normal.    The plan was reviewed with the patient/family, and all questions/concerned were addressed.  It was my pleasure to see Sabryn today and participate in her care. Please feel free to contact me with any questions or concerns.  Sincerely,  Orlan Cramp, DO Allergy & Immunology  [facility]  [place] office: [phone] [facility] office: [phone]

## 2024-12-27 ENCOUNTER — Encounter: Admitting: Medical-Surgical
